# Patient Record
Sex: Female | Born: 1975 | Race: Black or African American | Hispanic: No | Marital: Married | Smoking: Never smoker
Health system: Southern US, Community
[De-identification: ages and names within clinical notes are randomized; demographics above are authoritative.]

## PROBLEM LIST (undated history)

## (undated) DIAGNOSIS — T7840XA Allergy, unspecified, initial encounter: Secondary | ICD-10-CM

## (undated) DIAGNOSIS — I10 Essential (primary) hypertension: Secondary | ICD-10-CM

## (undated) DIAGNOSIS — R7303 Prediabetes: Secondary | ICD-10-CM

## (undated) DIAGNOSIS — E559 Vitamin D deficiency, unspecified: Secondary | ICD-10-CM

## (undated) DIAGNOSIS — E785 Hyperlipidemia, unspecified: Secondary | ICD-10-CM

## (undated) DIAGNOSIS — E611 Iron deficiency: Secondary | ICD-10-CM

## (undated) HISTORY — DX: Iron deficiency: E61.1

## (undated) HISTORY — PX: CHOLECYSTECTOMY: SHX55

## (undated) HISTORY — DX: Essential (primary) hypertension: I10

## (undated) HISTORY — DX: Prediabetes: R73.03

## (undated) HISTORY — DX: Hyperlipidemia, unspecified: E78.5

## (undated) HISTORY — DX: Allergy, unspecified, initial encounter: T78.40XA

## (undated) HISTORY — DX: Vitamin D deficiency, unspecified: E55.9

---

## 2003-05-09 ENCOUNTER — Ambulatory Visit (HOSPITAL_COMMUNITY): Admission: RE | Admit: 2003-05-09 | Discharge: 2003-05-09 | Payer: Self-pay | Admitting: Obstetrics & Gynecology

## 2003-05-09 ENCOUNTER — Encounter: Payer: Self-pay | Admitting: Obstetrics & Gynecology

## 2003-07-04 ENCOUNTER — Ambulatory Visit (HOSPITAL_COMMUNITY): Admission: RE | Admit: 2003-07-04 | Discharge: 2003-07-04 | Payer: Self-pay | Admitting: Obstetrics & Gynecology

## 2003-07-04 ENCOUNTER — Encounter: Payer: Self-pay | Admitting: Obstetrics & Gynecology

## 2003-12-03 ENCOUNTER — Inpatient Hospital Stay (HOSPITAL_COMMUNITY): Admission: AD | Admit: 2003-12-03 | Discharge: 2003-12-05 | Payer: Self-pay | Admitting: Obstetrics & Gynecology

## 2004-08-20 ENCOUNTER — Emergency Department (HOSPITAL_COMMUNITY): Admission: EM | Admit: 2004-08-20 | Discharge: 2004-08-20 | Payer: Self-pay | Admitting: Emergency Medicine

## 2008-06-17 ENCOUNTER — Emergency Department (HOSPITAL_COMMUNITY): Admission: EM | Admit: 2008-06-17 | Discharge: 2008-06-17 | Payer: Self-pay | Admitting: Emergency Medicine

## 2015-12-30 MED FILL — NYSTATIN 100,000 UNIT/GM CR: 100000 | 20 days supply | Qty: 30 | Fill #0

## 2016-02-10 MED FILL — SPIRONOLACTONE 25 MG TABLET: 25 | 90 days supply | Qty: 90 | Fill #2

## 2016-02-10 MED FILL — FOLIC ACID 1 MG TABLET: 1 | 90 days supply | Qty: 90 | Fill #2

## 2016-02-12 MED FILL — METFORMIN HCL ER 750 MG TAB: 750 | 30 days supply | Qty: 60 | Fill #2

## 2016-04-26 MED FILL — METFORMIN HCL ER 750 MG TAB: 750 | 30 days supply | Qty: 60 | Fill #3

## 2016-07-26 DIAGNOSIS — D508 Other iron deficiency anemias: Secondary | ICD-10-CM | POA: Diagnosis not present

## 2016-07-26 DIAGNOSIS — R7303 Prediabetes: Secondary | ICD-10-CM | POA: Diagnosis not present

## 2016-07-26 DIAGNOSIS — Z1231 Encounter for screening mammogram for malignant neoplasm of breast: Secondary | ICD-10-CM | POA: Diagnosis not present

## 2016-07-26 DIAGNOSIS — E559 Vitamin D deficiency, unspecified: Secondary | ICD-10-CM | POA: Diagnosis not present

## 2016-07-26 DIAGNOSIS — R87610 Atypical squamous cells of undetermined significance on cytologic smear of cervix (ASC-US): Secondary | ICD-10-CM | POA: Diagnosis not present

## 2016-07-26 DIAGNOSIS — Z01419 Encounter for gynecological examination (general) (routine) without abnormal findings: Secondary | ICD-10-CM | POA: Diagnosis not present

## 2016-07-26 DIAGNOSIS — R87810 Cervical high risk human papillomavirus (HPV) DNA test positive: Secondary | ICD-10-CM | POA: Diagnosis not present

## 2016-07-26 DIAGNOSIS — Z1151 Encounter for screening for human papillomavirus (HPV): Secondary | ICD-10-CM | POA: Diagnosis not present

## 2016-07-28 MED FILL — VIT D2 1.25 MG (50,000 UNIT: 1.25 MG | 84 days supply | Qty: 12 | Fill #0

## 2016-07-28 MED FILL — HYDROCORT-PRAMOXINE 2.5-1%: 2.5-1 | 10 days supply | Qty: 30 | Fill #0

## 2016-07-28 MED FILL — SPIRONOLACTONE 50 MG TABLET: 50 | 90 days supply | Qty: 90 | Fill #0

## 2016-07-28 MED FILL — METFORMIN HCL ER 750 MG TAB: 750 | 30 days supply | Qty: 60 | Fill #0

## 2016-07-28 MED FILL — METOPROLOL SUCC ER 50 MG TA: 50 | 90 days supply | Qty: 90 | Fill #0

## 2016-07-28 MED FILL — FOLIC ACID 1 MG TABLET: 1 | 90 days supply | Qty: 90 | Fill #0

## 2016-07-29 MED FILL — FERREX 150 FORTE CAPSULE: 150-1-25 | 90 days supply | Qty: 90 | Fill #0

## 2016-09-10 DIAGNOSIS — R87610 Atypical squamous cells of undetermined significance on cytologic smear of cervix (ASC-US): Secondary | ICD-10-CM | POA: Diagnosis not present

## 2016-10-25 MED FILL — METFORMIN HCL ER 750 MG TAB: 750 | 30 days supply | Qty: 60 | Fill #1

## 2016-10-25 MED FILL — VIT D2 1.25 MG (50,000 UNIT: 1.25 MG | 84 days supply | Qty: 12 | Fill #1

## 2016-11-19 MED FILL — FOLIC ACID 1 MG TABLET: 1 | 90 days supply | Qty: 90 | Fill #1

## 2016-11-19 MED FILL — FERREX 150 FORTE CAPSULE: 150-1-25 | 90 days supply | Qty: 90 | Fill #1

## 2017-02-23 MED FILL — FERREX 150 FORTE CAPSULE: 150-1-25 | 90 days supply | Qty: 90 | Fill #2

## 2017-02-23 MED FILL — METFORMIN HCL ER 750 MG TAB: 750 | 30 days supply | Qty: 60 | Fill #2

## 2017-08-29 DIAGNOSIS — R87610 Atypical squamous cells of undetermined significance on cytologic smear of cervix (ASC-US): Secondary | ICD-10-CM | POA: Diagnosis not present

## 2017-08-29 DIAGNOSIS — Z13220 Encounter for screening for lipoid disorders: Secondary | ICD-10-CM | POA: Diagnosis not present

## 2017-08-29 DIAGNOSIS — I10 Essential (primary) hypertension: Secondary | ICD-10-CM | POA: Diagnosis not present

## 2017-08-29 DIAGNOSIS — R7303 Prediabetes: Secondary | ICD-10-CM | POA: Diagnosis not present

## 2017-08-29 DIAGNOSIS — D508 Other iron deficiency anemias: Secondary | ICD-10-CM | POA: Diagnosis not present

## 2017-08-29 DIAGNOSIS — Z6834 Body mass index (BMI) 34.0-34.9, adult: Secondary | ICD-10-CM | POA: Diagnosis not present

## 2017-08-29 DIAGNOSIS — Z1231 Encounter for screening mammogram for malignant neoplasm of breast: Secondary | ICD-10-CM | POA: Diagnosis not present

## 2017-08-29 DIAGNOSIS — Z1151 Encounter for screening for human papillomavirus (HPV): Secondary | ICD-10-CM | POA: Diagnosis not present

## 2017-08-29 DIAGNOSIS — Z01419 Encounter for gynecological examination (general) (routine) without abnormal findings: Secondary | ICD-10-CM | POA: Diagnosis not present

## 2017-08-29 MED FILL — AMLODIPINE BESYLATE 2.5 MG: 2.5 | 30 days supply | Qty: 30 | Fill #0

## 2017-08-29 MED FILL — HYDROCHLOROTHIAZIDE 12.5 MG: 12.5 | 30 days supply | Qty: 30 | Fill #0

## 2017-09-06 MED FILL — VIT D2 1.25 MG (50,000 UNIT: 1.25 MG | 84 days supply | Qty: 12 | Fill #0

## 2017-09-06 MED FILL — FERREX 150 CAPSULE: 150 | 90 days supply | Qty: 90 | Fill #0

## 2017-09-06 MED FILL — MAGNESIUM OXIDE 400 MG TAB: 400 (240 MG | 90 days supply | Qty: 90 | Fill #0

## 2017-10-04 DIAGNOSIS — I10 Essential (primary) hypertension: Secondary | ICD-10-CM | POA: Diagnosis not present

## 2017-10-04 MED FILL — AMLODIPINE BESYLATE 5 MG TA: 5 | 90 days supply | Qty: 90 | Fill #0

## 2017-10-04 MED FILL — HYDROCHLOROTHIAZIDE 12.5 MG: 12.5 | 90 days supply | Qty: 90 | Fill #0

## 2017-12-02 MED FILL — MAGNESIUM OXIDE 400 MG TAB: 400 (240 MG | 90 days supply | Qty: 90 | Fill #1

## 2017-12-02 MED FILL — FERREX 150 CAPSULE: 150 | 90 days supply | Qty: 90 | Fill #1

## 2017-12-02 MED FILL — VIT D2 1.25 MG (50,000 UNIT: 1.25 MG | 84 days supply | Qty: 12 | Fill #1

## 2018-01-08 MED FILL — HYDROCHLOROTHIAZIDE 12.5 MG: 12.5 | 90 days supply | Qty: 90 | Fill #1

## 2018-01-08 MED FILL — AMLODIPINE BESYLATE 5 MG TA: 5 | 90 days supply | Qty: 90 | Fill #1

## 2018-02-03 DIAGNOSIS — D508 Other iron deficiency anemias: Secondary | ICD-10-CM | POA: Diagnosis not present

## 2018-02-03 DIAGNOSIS — Z0000 Encounter for general adult medical examination without abnormal findings: Secondary | ICD-10-CM | POA: Diagnosis not present

## 2018-02-03 DIAGNOSIS — E559 Vitamin D deficiency, unspecified: Secondary | ICD-10-CM | POA: Diagnosis not present

## 2018-02-03 DIAGNOSIS — I10 Essential (primary) hypertension: Secondary | ICD-10-CM | POA: Diagnosis not present

## 2018-02-28 MED FILL — VIT D2 1.25 MG (50,000 UNIT: 1.25 MG | 84 days supply | Qty: 12 | Fill #0

## 2018-02-28 MED FILL — MAGNESIUM OXIDE 400 MG TAB: 400 (240 MG | 90 days supply | Qty: 90 | Fill #0

## 2018-02-28 MED FILL — FERREX 150 CAPSULE: 150 | 90 days supply | Qty: 90 | Fill #0

## 2018-04-10 MED FILL — AMLODIPINE BESYLATE 5 MG TA: 5 | 90 days supply | Qty: 90 | Fill #0

## 2018-04-10 MED FILL — HYDROCHLOROTHIAZIDE 12.5 MG: 12.5 | 90 days supply | Qty: 90 | Fill #0

## 2018-05-23 MED FILL — VIT D2 1.25 MG (50,000 UNIT: 1.25 MG | 84 days supply | Qty: 12 | Fill #1

## 2018-06-01 MED FILL — MAGNESIUM OXIDE 400 MG TAB: 400 (240 MG | 90 days supply | Qty: 90 | Fill #1

## 2018-06-01 MED FILL — FERREX 150 CAPSULE: 150 | 90 days supply | Qty: 90 | Fill #1

## 2018-07-12 MED FILL — AMLODIPINE BESYLATE 5 MG TA: 5 | 90 days supply | Qty: 90 | Fill #1

## 2018-07-12 MED FILL — HYDROCHLOROTHIAZIDE 12.5 MG: 12.5 | 90 days supply | Qty: 90 | Fill #1

## 2018-10-11 DIAGNOSIS — D508 Other iron deficiency anemias: Secondary | ICD-10-CM | POA: Diagnosis not present

## 2018-10-11 DIAGNOSIS — Z01419 Encounter for gynecological examination (general) (routine) without abnormal findings: Secondary | ICD-10-CM | POA: Diagnosis not present

## 2018-10-11 DIAGNOSIS — Z6834 Body mass index (BMI) 34.0-34.9, adult: Secondary | ICD-10-CM | POA: Diagnosis not present

## 2018-10-11 DIAGNOSIS — Z1231 Encounter for screening mammogram for malignant neoplasm of breast: Secondary | ICD-10-CM | POA: Diagnosis not present

## 2018-10-11 MED FILL — AMLODIPINE BESYLATE 5 MG TA: 5 | 90 days supply | Qty: 90 | Fill #0

## 2018-10-11 MED FILL — HYDROCHLOROTHIAZIDE 12.5 MG: 12.5 | 90 days supply | Qty: 90 | Fill #0

## 2018-10-11 MED FILL — FERREX 150 CAPSULE: 150 | 90 days supply | Qty: 90 | Fill #0

## 2018-10-11 MED FILL — MAGNESIUM OXIDE 400 MG TAB: 400 (240 MG | 90 days supply | Qty: 90 | Fill #0

## 2018-12-25 MED FILL — TERCONAZOLE 0.8% VAGINAL CR: 0.8 | 3 days supply | Qty: 20 | Fill #0

## 2019-01-11 MED FILL — FERREX 150 CAPSULE: 150 | 90 days supply | Qty: 90 | Fill #1

## 2019-01-11 MED FILL — HYDROCHLOROTHIAZIDE 12.5 MG: 12.5 | 90 days supply | Qty: 90 | Fill #1

## 2019-01-11 MED FILL — MAGNESIUM OXIDE 400 MG TAB: 400 (240 MG | 90 days supply | Qty: 90 | Fill #1

## 2019-02-15 MED FILL — AMLODIPINE BESYLATE 5 MG TA: 5 | 90 days supply | Qty: 90 | Fill #1

## 2019-04-12 MED FILL — MAGNESIUM OXIDE 400 MG TAB: 400 (240 MG | 90 days supply | Qty: 90 | Fill #2

## 2019-04-12 MED FILL — HYDROCHLOROTHIAZIDE 12.5 MG: 12.5 | 90 days supply | Qty: 90 | Fill #2

## 2019-04-12 MED FILL — FERREX 150 CAPSULE: 150 | 30 days supply | Qty: 30 | Fill #0

## 2019-04-20 MED FILL — FERREX 150 CAPSULE: 150 | 30 days supply | Qty: 30 | Fill #0

## 2019-05-08 DIAGNOSIS — I10 Essential (primary) hypertension: Secondary | ICD-10-CM | POA: Diagnosis not present

## 2019-05-08 DIAGNOSIS — D508 Other iron deficiency anemias: Secondary | ICD-10-CM | POA: Diagnosis not present

## 2019-05-16 MED FILL — FERREX 150 CAPSULE: 150 | 30 days supply | Qty: 30 | Fill #0

## 2019-05-18 MED FILL — AMLODIPINE BESYLATE 5 MG TA: 5 | 90 days supply | Qty: 90 | Fill #2

## 2019-07-06 MED FILL — HYDROCHLOROTHIAZIDE 12.5 MG: 12.5 | 90 days supply | Qty: 90 | Fill #3

## 2019-07-06 MED FILL — MAGNESIUM OXIDE 400 MG TAB: 400 (240 MG | 90 days supply | Qty: 90 | Fill #3

## 2019-10-10 MED FILL — MAGNESIUM OXIDE 400 MG TAB: 400 (240 MG | 90 days supply | Qty: 90 | Fill #4

## 2019-10-10 MED FILL — HYDROCHLOROTHIAZIDE 12.5 MG: 12.5 | 90 days supply | Qty: 90 | Fill #4

## 2019-10-19 DIAGNOSIS — D508 Other iron deficiency anemias: Secondary | ICD-10-CM | POA: Diagnosis not present

## 2019-10-19 DIAGNOSIS — Z01419 Encounter for gynecological examination (general) (routine) without abnormal findings: Secondary | ICD-10-CM | POA: Diagnosis not present

## 2019-10-19 DIAGNOSIS — Z6830 Body mass index (BMI) 30.0-30.9, adult: Secondary | ICD-10-CM | POA: Diagnosis not present

## 2019-10-19 DIAGNOSIS — I10 Essential (primary) hypertension: Secondary | ICD-10-CM | POA: Diagnosis not present

## 2019-10-19 DIAGNOSIS — Z0000 Encounter for general adult medical examination without abnormal findings: Secondary | ICD-10-CM | POA: Diagnosis not present

## 2019-10-19 DIAGNOSIS — Z1231 Encounter for screening mammogram for malignant neoplasm of breast: Secondary | ICD-10-CM | POA: Diagnosis not present

## 2019-10-19 DIAGNOSIS — Z131 Encounter for screening for diabetes mellitus: Secondary | ICD-10-CM | POA: Diagnosis not present

## 2019-10-22 MED FILL — AMLODIPINE BESYLATE 5 MG TA: 5 | 90 days supply | Qty: 90 | Fill #0

## 2019-10-22 MED FILL — MAGNESIUM OXIDE 400 MG TAB: 400 (240 MG | 90 days supply | Qty: 90 | Fill #0

## 2020-01-10 ENCOUNTER — Other Ambulatory Visit: Payer: Self-pay

## 2020-01-10 ENCOUNTER — Ambulatory Visit (INDEPENDENT_AMBULATORY_CARE_PROVIDER_SITE_OTHER): Payer: No Typology Code available for payment source

## 2020-01-10 ENCOUNTER — Encounter: Payer: Self-pay | Admitting: Family Medicine

## 2020-01-10 ENCOUNTER — Ambulatory Visit: Payer: No Typology Code available for payment source | Admitting: Family Medicine

## 2020-01-10 VITALS — BP 110/82 | HR 84 | Ht 66.5 in | Wt 198.8 lb

## 2020-01-10 DIAGNOSIS — M5412 Radiculopathy, cervical region: Secondary | ICD-10-CM

## 2020-01-10 DIAGNOSIS — M25511 Pain in right shoulder: Secondary | ICD-10-CM

## 2020-01-10 MED ORDER — GABAPENTIN 300 MG PO CAPS
ORAL_CAPSULE | ORAL | 3 refills | Status: DC
Start: 2020-01-10 — End: 2021-01-30

## 2020-01-10 MED ORDER — HYDROCODONE-ACETAMINOPHEN 5-325 MG PO TABS: 1.0000 | ORAL_TABLET | Freq: Four times a day (QID) | ORAL | 0 refills | Status: DC | PRN

## 2020-01-10 NOTE — Patient Instructions (Addendum)
Thank you for coming in today. Get xray now.  Schedule MRI soon.  Take gabapentin and use hydrocodone sparingly.   I will order PT once MRI is done If MRI is taking a while let me know and I can get PT going sooner.   Come back or go to the emergency room if you notice new weakness new numbness problems walking or bowel or bladder problems.  Cervical Radiculopathy  Cervical radiculopathy means that a nerve in the neck (a cervical nerve) is pinched or bruised. This can happen because of an injury to the cervical spine (vertebrae) in the neck, or as a normal part of getting older. This can cause pain or loss of feeling (numbness) that runs from your neck all the way down to your arm and fingers. Often, this condition gets better with rest. Treatment may be needed if the condition does not get better. What are the causes?  A neck injury.  A bulging disk in your spine.  Muscle movements that you cannot control (muscle spasms).  Tight muscles in your neck due to overuse.  Arthritis.  Breakdown in the bones and joints of the spine (spondylosis) due to getting older.  Bone spurs that form near the nerves in the neck. What are the signs or symptoms?  Pain. The pain may: ? Run from the neck to the arm and hand. ? Be very bad or irritating. ? Be worse when you move your neck.  Loss of feeling or tingling in your arm or hand.  Weakness in your arm or hand, in very bad cases. How is this treated? In many cases, treatment is not needed for this condition. With rest, the condition often gets better over time. If treatment is needed, options may include:  Wearing a soft neck collar (cervical collar) for short periods of time, as told by your doctor.  Doing exercises (physical therapy) to strengthen your neck muscles.  Taking medicines.  Having shots (injections) in your spine, in very bad cases.  Having surgery. This may be needed if other treatments do not help. The type of surgery  that is used depends on the cause of your condition. Follow these instructions at home: If you have a soft neck collar:  Wear it as told by your doctor. Remove it only as told by your doctor.  Ask your doctor if you can remove the collar for cleaning and bathing. If you are allowed to remove the collar for cleaning or bathing: ? Follow instructions from your doctor about how to remove the collar safely. ? Clean the collar by wiping it with mild soap and water and drying it completely. ? Take out any removable pads in the collar every 1-2 days. Wash them by hand with soap and water. Let them air-dry completely before you put them back in the collar. ? Check your skin under the collar for redness or sores. If you see any, tell your doctor. Managing pain      Take over-the-counter and prescription medicines only as told by your doctor.  If told, put ice on the painful area. ? If you have a soft neck collar, remove it as told by your doctor. ? Put ice in a plastic bag. ? Place a towel between your skin and the bag. ? Leave the ice on for 20 minutes, 2-3 times a day.  If using ice does not help, you can try using heat. Use the heat source that your doctor recommends, such as a moist  heat pack or a heating pad. ? Place a towel between your skin and the heat source. ? Leave the heat on for 20-30 minutes. ? Remove the heat if your skin turns bright red. This is very important if you are unable to feel pain, heat, or cold. You may have a greater risk of getting burned.  You may try a gentle neck and shoulder rub (massage). Activity  Rest as needed.  Return to your normal activities as told by your doctor. Ask your doctor what activities are safe for you.  Do exercises as told by your doctor or physical therapist.  Do not lift anything that is heavier than 10 lb (4.5 kg) until your doctor tells you that it is safe. General instructions  Use a flat pillow when you sleep.  Do not drive  while wearing a soft neck collar. If you do not have a soft neck collar, ask your doctor if it is safe to drive while your neck heals.  Ask your doctor if the medicine prescribed to you requires you to avoid driving or using heavy machinery.  Do not use any products that contain nicotine or tobacco, such as cigarettes, e-cigarettes, and chewing tobacco. These can delay healing. If you need help quitting, ask your doctor.  Keep all follow-up visits as told by your doctor. This is important. Contact a doctor if:  Your condition does not get better with treatment. Get help right away if:  Your pain gets worse and is not helped with medicine.  You lose feeling or feel weak in your hand, arm, face, or leg.  You have a high fever.  You have a stiff neck.  You cannot control when you poop or pee (have incontinence).  You have trouble with walking, balance, or talking. Summary  Cervical radiculopathy means that a nerve in the neck is pinched or bruised.  A nerve can get pinched from a bulging disk, arthritis, an injury to the neck, or other causes.  Symptoms include pain, tingling, or loss of feeling that goes from the neck into the arm or hand.  Weakness in your arm or hand can happen in very bad cases.  Treatment may include resting, wearing a soft neck collar, and doing exercises. You might need to take medicines for pain. In very bad cases, shots or surgery may be needed. This information is not intended to replace advice given to you by your health care provider. Make sure you discuss any questions you have with your health care provider. Document Revised: 10/20/2018 Document Reviewed: 10/20/2018 Elsevier Patient Education  2020 [employer].

## 2020-01-10 NOTE — Progress Notes (Signed)
Subjective:    CC: R shoulder and arm pain  I, Karla Krause, LAT, ATC, am serving as scribe for Dr. Lynne Leader.  HPI: Pt is a 44 y/o LHD female presenting w/ c/o R arm and shoulder pain x 2 weeks w/ no known MOI.  Pt rates her pain at a 9/10 and describes her pain as aching and throbbing.  Pain radiates to the right forearm but not to the hand.  No numbness or tingling into the hand.  She states she went to the Urgent Care last Saturday and had an IM injection and was prescribed a steroid dose pack that she will finish tomorrow.  Pt reports no change in her symptoms w/ those treatments.  Pain seems to be worse with neck motion.  Pain is a bit improved by putting her hand over her head.  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  She has not noticed any weakness.  Radiating pain: into R upper arm Neck pain: yes Numbness/tingling: Yes into R fingers Mechanical R shoulder symptoms: No Aggravating factors: R shoulder and arm AROM and any type of pressing or propping-type activity Treatments tried: steroid IM injection and oral steroid dose pack; Biofreeze; IBU  Pertinent review of Systems: No fevers or chills  Relevant historical information: No history of shoulder injury or cervical radiculopathy in the past.   Objective:    Vitals:   01/10/20 1504  BP: 110/82  Pulse: 84  SpO2: 97%   General: Well Developed, well nourished, and in no acute distress.   MSK:  C-spine: Normal-appearing nontender to midline.  Mildly tender palpation right trapezius. Normal cervical motion. Significantly positive Spurling's test with left lateral neck flexion worsening right arm symptoms. Upper extremity strength slightly diminished 4/5 deltoid/arm abduction.  Otherwise right upper extremity strength is intact. Reflexes equal normal bilateral upper extremities. Sensation is intact throughout.  Right shoulder: Normal-appearing normal motion. Abduction slightly  diminished as above 4/5.  External rotation internal rotation strength normal. Negative Hawkins and Neer's test. Negative Yergason's and speeds test.  Contralateral left shoulder normal-appearing normal motion nontender normal strength negative impingement testing.  Lab and Radiology Results X-ray images C-spine obtained today personally and independently reviewed Loss of cervical lordosis.  DDD at C6-7.  No acute fractures. Await formal radiology review   Impression and Recommendations:    Assessment and Plan: 44 y.o. female with right shoulder and arm pain.  Based on physical exam and symptoms her pain is due to cervical radiculopathy.  Most likely C5 or T1 nerve root based on pain radiating to the forearm but not to the hand.  Although patchy distribution of C6, C7 or C8 is a possibility as well. Patient is already had trial of steroid Dosepak with little benefit.  We will add gabapentin hydrocodone for pain control and proceed with MRI.  MRI needed earlier than 6 weeks in this case because she is experiencing weakness in her right arm especially with shoulder abduction/deltoid.  Worsening/progressive symptoms.  PDMP reviewed during this encounter. Orders Placed This Encounter  Procedures  . DG Cervical Spine Complete    Standing Status:   Future    Number of Occurrences:   1    Standing Expiration Date:   03/09/2021    Order Specific Question:   Reason for Exam (SYMPTOM  OR DIAGNOSIS REQUIRED)    Answer:   eval right arm pain suspect C5 or T1 radic    Order  Specific Question:   Is patient pregnant?    Answer:   No    Order Specific Question:   Preferred imaging location?    Answer:   [facility]    Order Specific Question:   Radiology Contrast Protocol - do NOT remove file path    Answer:   \\[email or web address]  . MR CERVICAL SPINE WO CONTRAST    Standing Status:   Future    Standing Expiration Date:   03/09/2021    Order Specific  Question:   What is the patient's sedation requirement?    Answer:   No Sedation    Order Specific Question:   Does the patient have a pacemaker or implanted devices?    Answer:   No    Order Specific Question:   Preferred imaging location?    Answer:   [facility] (table limit-350lbs)    Order Specific Question:   Radiology Contrast Protocol - do NOT remove file path    Answer:   \\[email or web address]   Meds ordered this encounter  Medications  . gabapentin (NEURONTIN) 300 MG capsule    Sig: One tab PO qHS for a week, then BID for a week, then TID. May double weekly to a max of 3,600mg /day    Dispense:  180 capsule    Refill:  3  . HYDROcodone-acetaminophen (NORCO/VICODIN) 5-325 MG tablet    Sig: Take 1 tablet by mouth every 6 (six) hours as needed.    Dispense:  15 tablet    Refill:  0    Discussed warning signs or symptoms. Please see discharge instructions. Patient expresses understanding.   The above documentation has been reviewed and is accurate and complete [name]

## 2020-01-11 NOTE — Progress Notes (Signed)
Arthritis and narrowing of the spine seen at C5 and C6 and C6 and C7.  A pinched nerve in this area could cause pain down your arm like you have.  MRI will be helpful here.  You should hear about MRI scheduling soon.

## 2020-01-21 ENCOUNTER — Ambulatory Visit (INDEPENDENT_AMBULATORY_CARE_PROVIDER_SITE_OTHER): Payer: No Typology Code available for payment source

## 2020-01-21 ENCOUNTER — Other Ambulatory Visit: Payer: Self-pay

## 2020-01-21 DIAGNOSIS — M5412 Radiculopathy, cervical region: Secondary | ICD-10-CM

## 2020-01-22 ENCOUNTER — Encounter: Payer: Self-pay | Admitting: Family Medicine

## 2020-01-22 ENCOUNTER — Ambulatory Visit: Payer: No Typology Code available for payment source | Admitting: Family Medicine

## 2020-01-22 ENCOUNTER — Telehealth: Payer: Self-pay | Admitting: Family Medicine

## 2020-01-22 VITALS — BP 120/76 | HR 94 | Ht 66.5 in | Wt 201.8 lb

## 2020-01-22 DIAGNOSIS — M5412 Radiculopathy, cervical region: Secondary | ICD-10-CM | POA: Diagnosis not present

## 2020-01-22 NOTE — Telephone Encounter (Signed)
Routed/faxed epidural order and info to [facility].

## 2020-01-22 NOTE — Telephone Encounter (Signed)
Epidural steroid injection ordered.  Please notify [facility] imaging.

## 2020-01-22 NOTE — Progress Notes (Signed)
MRI was very helpful. MRI shows a bulging disc at C5-C6 pressing on the right C6 nerve root causing your right arm pain.  We will try to treat this with an injection into your neck that should help.  Recommend return to clinic to discuss the MRI in full detail and to discuss the neck injection.  However I will go ahead and order the neck injection now so we can get the process started to get you feeling better.

## 2020-01-22 NOTE — Patient Instructions (Addendum)
Thank you for coming in today. Please call [facility] to schedule the injection.  [phone] Keep me updated after the injection.  We may need to do more than 1.    Epidural Steroid Injection Patient Information  Description: The epidural space surrounds the nerves as they exit the spinal cord.  In some patients, the nerves can be compressed and inflamed by a bulging disc or a tight spinal canal (spinal stenosis).  By injecting steroids into the epidural space, we can bring irritated nerves into direct contact with a potentially helpful medication.  These steroids act directly on the irritated nerves and can reduce swelling and inflammation which often leads to decreased pain.  Epidural steroids may be injected anywhere along the spine and from the neck to the low back depending upon the location of your pain.   After numbing the skin with local anesthetic (like Novocaine), a small needle is passed into the epidural space slowly.  You may experience a sensation of pressure while this is being done.  The entire block usually last less than 10 minutes.  Conditions which may be treated by epidural steroids:   Low back and leg pain  Neck and arm pain  Spinal stenosis  Post-laminectomy syndrome  Herpes zoster (shingles) pain  Pain from compression fractures  Preparation for the injection:  1. Do not eat any solid food or dairy products within 8 hours of your appointment.  2. You may drink clear liquids up to 3 hours before appointment.  Clear liquids include water, black coffee, juice or soda.  No milk or cream please. 3. You may take your regular medication, including pain medications, with a sip of water before your appointment  Diabetics should hold regular insulin (if taken separately) and take 1/2 normal NPH dos the morning of the procedure.  Carry some sugar containing items with you to your appointment. 4. A driver must accompany you and be prepared to drive you home  after your procedure.  5. Bring all your current medications with your. 6. An IV may be inserted and sedation may be given at the discretion of the physician.   7. A blood pressure cuff, EKG and other monitors will often be applied during the procedure.  Some patients may need to have extra oxygen administered for a short period. 8. You will be asked to provide medical information, including your allergies, prior to the procedure.  We must know immediately if you are taking blood thinners (like Coumadin/Warfarin)  Or if you are allergic to IV iodine contrast (dye). We must know if you could possible be pregnant.  Possible side-effects:  Bleeding from needle site  Infection (rare, may require surgery)  Nerve injury (rare)  Numbness & tingling (temporary)  Difficulty urinating (rare, temporary)  Spinal headache ( a headache worse with upright posture)  Light -headedness (temporary)  Pain at injection site (several days)  Decreased blood pressure (temporary)  Weakness in arm/leg (temporary)  Pressure sensation in back/neck (temporary)  Call if you experience:  Fever/chills associated with headache or increased back/neck pain.  Headache worsened by an upright position.  New onset weakness or numbness of an extremity below the injection site  Hives or difficulty breathing (go to the emergency room)  Inflammation or drainage at the infection site  Severe back/neck pain  Any new symptoms which are concerning to you  Please note:  Although the local anesthetic injected can often make your back or neck feel good for several hours after  the injection, the pain will likely return.  It takes 3-7 days for steroids to work in the epidural space.  You may not notice any pain relief for at least that one week.  If effective, we will often do a series of three injections spaced 3-6 weeks apart to maximally decrease your pain.  After the initial series, we generally will wait several  months before considering a repeat injection of the same type.  c

## 2020-01-22 NOTE — Progress Notes (Signed)
I, Christoper Fabian, LAT, ATC, am serving as scribe for Dr. [name].  Karla Krause is a 44 y.o. female who presents to [facility] at [facility] today for f/u of neck and R arm pain and c-spine MRI review from 01/21/20.  Pt was last seen by Dr. Denyse Amass on 01/10/20 w/ c/o radiating R UE pain from her shoulder to her R upper arm and numbness/tingling into her R hand/fingers.  She had a c-spine XR on 01/10/20 and an order was placed for a c-spine MRI.  She was also prescribed gabapentin and hydrocodone.  Since her last visit, pt reports no change in her symptoms.  She has been taking the gabapentin and hydrocodone as prescribed and notes slight improvement in her pain but not much.  She notes now she is experiencing a bit of numbness or tingling into her right thumb.  She denies any new or worsening weakness or contralateral left-sided symptoms.   Pertinent review of systems: No fevers or chills  Relevant historical information: No significant medical problems in history.   Exam:  BP 120/76 (BP Location: Left Arm, Patient Position: Sitting, Cuff Size: Large)   Pulse 94   Ht 5' 6.5" (1.689 m)   Wt 201 lb 12.8 oz (91.5 kg)   LMP 01/06/2020   SpO2 97%   BMI 32.08 kg/m  General: Well Developed, well nourished, and in no acute distress.   MSK: C-spine: Normal-appearing normal motion. Upper extremity strength is intact.    Lab and Radiology Results No results found for this or any previous visit (from the past 72 hour(s)). MR CERVICAL SPINE WO CONTRAST  Result Date: 01/21/2020 CLINICAL DATA:  Cervical radiculitis. Cervical radiculopathy, no red flags. Additional history provided by technologist: Cervical radiculitis for 3 weeks, right arm pain, numbness in right fingertips. EXAM: MRI CERVICAL SPINE WITHOUT CONTRAST TECHNIQUE: Multiplanar, multisequence MR imaging of the cervical spine was performed. No intravenous contrast was administered. COMPARISON:  Radiographs of the  cervical spine 01/10/2020 FINDINGS: Alignment: Reversal of the expected cervical lordosis centered at C5-C6. No significant spondylolisthesis. Vertebrae: Vertebral body height is maintained. No marrow edema or suspicious osseous lesion. Cord: No spinal cord signal abnormality. Posterior Fossa, vertebral arteries, paraspinal tissues: No abnormality identified within included portions of the posterior fossa. Flow voids preserved within the cervical vertebral arteries. Paraspinal soft tissues within normal limits. Disc levels: Moderate C5-C6 and C6-C7 disc degeneration. Mild disc degeneration at the remaining levels. C2-C3: No disc herniation. No significant canal or foraminal stenosis. C3-C4: No disc herniation. No significant canal or foraminal stenosis. C4-C5: Shallow disc bulge with superimposed small central disc protrusion. No significant spinal canal or neural foraminal narrowing. C5-C6: Disc bulge. Superimposed right center/foraminal disc protrusion (series 9, image 15) (series 8, images 5 and 6). Right greater than left disc osteophyte ridge/uncinate hypertrophy. The posterior disc osteophyte complex partially effaces the ventral thecal sac and may contact the ventral spinal cord, contributing to overall mild spinal canal stenosis. The right center/foraminal disc protrusion contributes to severe narrowing of the right foraminal entry zone with likely impingement of the exiting right C6 nerve root. No significant left neural foraminal narrowing. C6-C7: Shallow posterior disc osteophyte complex with mild endplate spurring and uncinate hypertrophy. No significant spinal canal stenosis or neural foraminal narrowing. C7-T1: No disc herniation. No significant canal or foraminal stenosis. IMPRESSION: Cervical spondylosis as described and most notably as follows. At C5-C6, disc bulge with superimposed right center/foraminal disc protrusion. Right greater than left disc osteophyte  ridge/uncinate hypertrophy. Mild  spinal canal stenosis with possible contact upon the ventral spinal cord. The disc protrusion contributes to severe narrowing of the right foraminal entry zone, likely impinging the exiting right C6 nerve root. Correlate for right C6 radiculopathy. No more than mild spinal canal stenosis at the remaining levels. No other sites of significant neural foraminal narrowing. Electronically Signed   By: Kellie Simmering DO   On: 01/21/2020 17:38    I, Lynne Leader, personally (independently) visualized and performed the interpretation of the images attached in this note.    Assessment and Plan: 44 y.o. female with right cervical radiculopathy at C6.  Plan for epidural steroid injection as typical conservative management is not helpful.  Recheck as needed.  Lengthy discussion regarding MRI findings and epidural steroid injection plan.  Patient will keep me updated with how she is feeling.      Discussed warning signs or symptoms. Please see discharge instructions. Patient expresses understanding.   The above documentation has been reviewed and is accurate and complete Lynne Leader

## 2020-02-01 ENCOUNTER — Inpatient Hospital Stay: Admission: RE | Admit: 2020-02-01 | Payer: No Typology Code available for payment source | Source: Ambulatory Visit

## 2020-02-04 ENCOUNTER — Other Ambulatory Visit: Payer: Self-pay

## 2020-02-04 ENCOUNTER — Ambulatory Visit
Admission: RE | Admit: 2020-02-04 | Discharge: 2020-02-04 | Disposition: A | Discharge status: Home / Self Care | Payer: No Typology Code available for payment source | Source: Ambulatory Visit | Attending: Family Medicine | Admitting: Family Medicine

## 2020-02-04 DIAGNOSIS — M5412 Radiculopathy, cervical region: Secondary | ICD-10-CM

## 2020-02-04 MED ORDER — TRIAMCINOLONE ACETONIDE 40 MG/ML IJ SUSP (RADIOLOGY): 60.0000 mg | Freq: Once | INTRAMUSCULAR | Status: DC

## 2020-02-04 MED ORDER — IOPAMIDOL (ISOVUE-M 300) INJECTION 61%
1.0000 mL | Freq: Once | INTRAMUSCULAR | Status: AC | PRN
  Administered 2020-02-04: 14:00:00 1 mL via EPIDURAL

## 2020-02-04 NOTE — Discharge Instructions (Signed)

## 2020-08-21 IMAGING — DX DG CERVICAL SPINE COMPLETE 4+V
6 series · 6 of 6 positions shown · non-contrast
Comparison: None.

CLINICAL DATA: Right-sided neck pain.

EXAM:
CERVICAL SPINE - COMPLETE 4+ VIEW

[cervical spine lat]
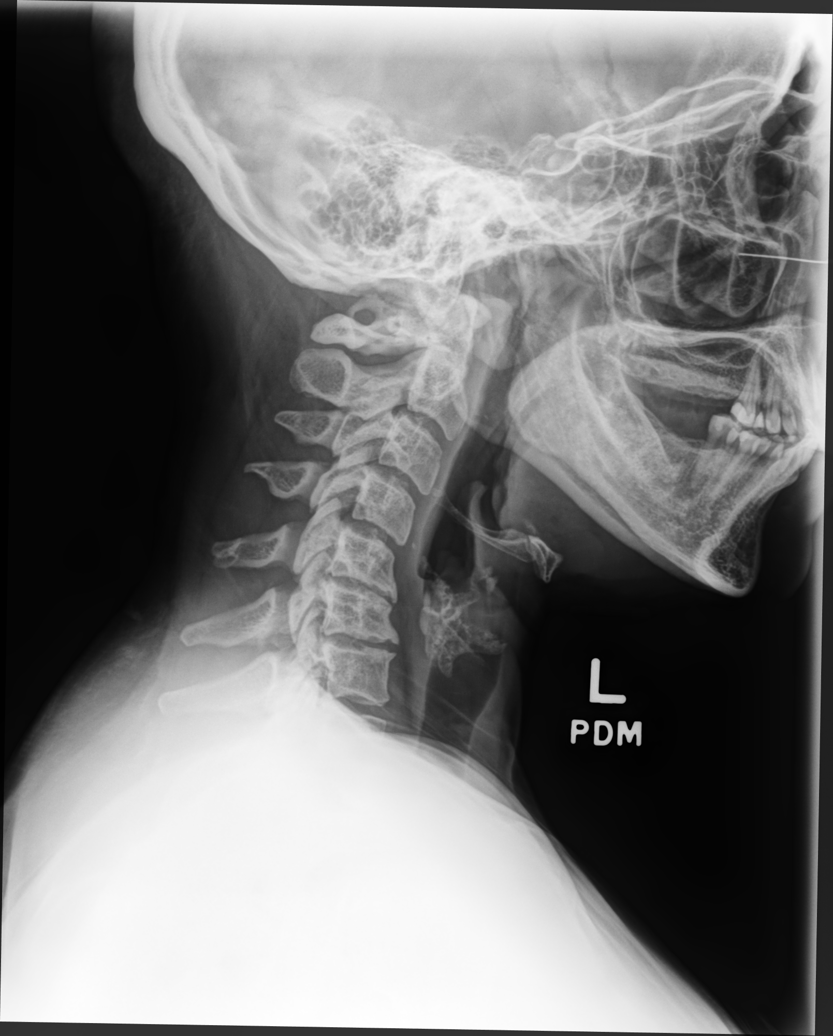

[cervical spine mlo (1 of 2)]
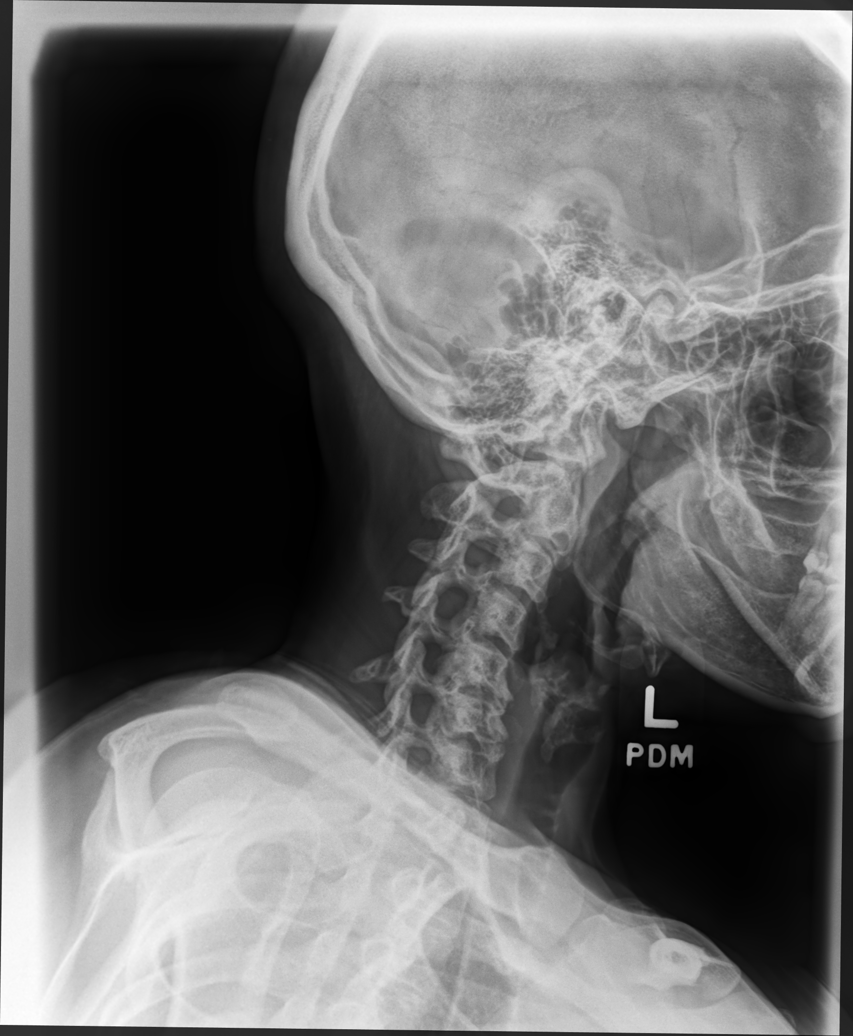

[cervical spine mlo (2 of 2)]
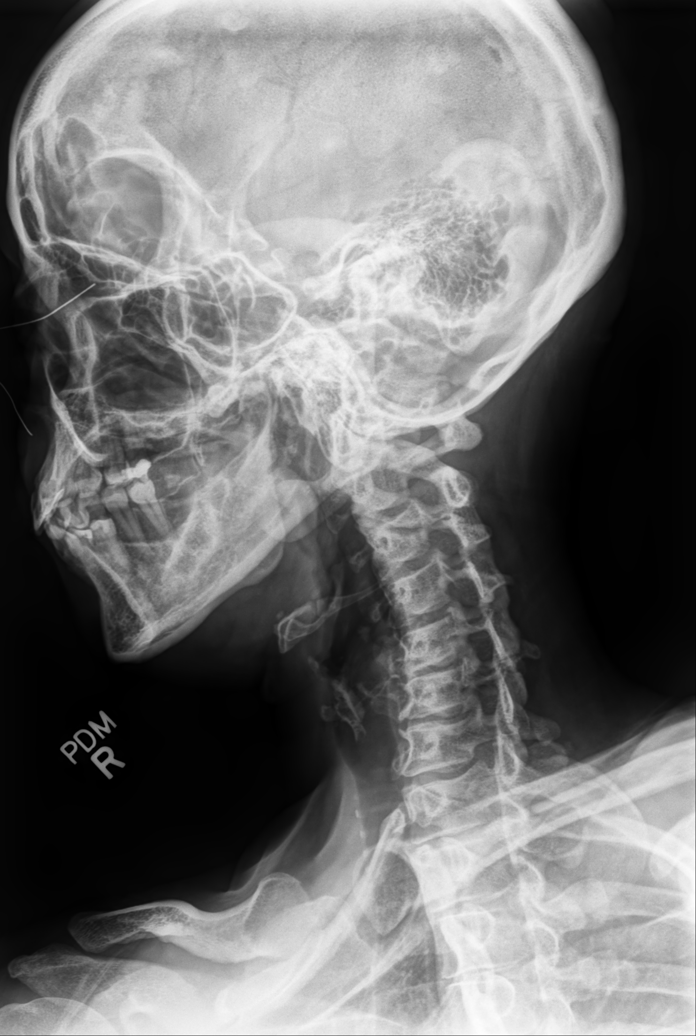

[cervical spine ap]
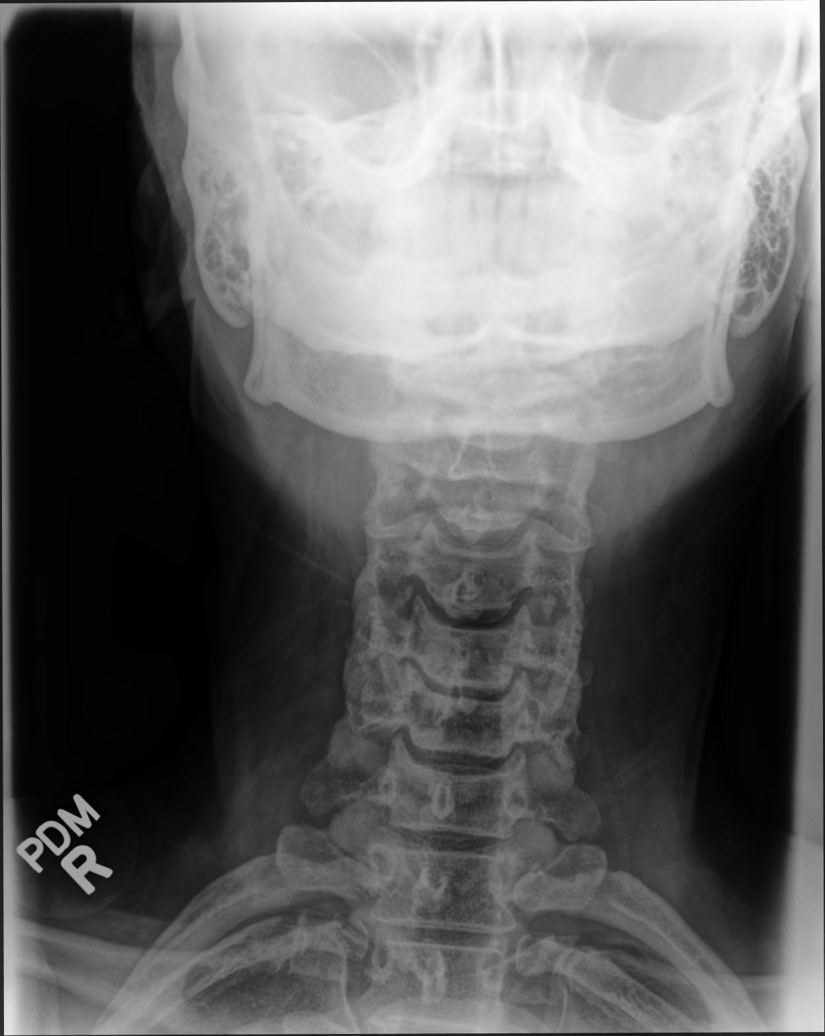

[cervical spine open mouth ap]
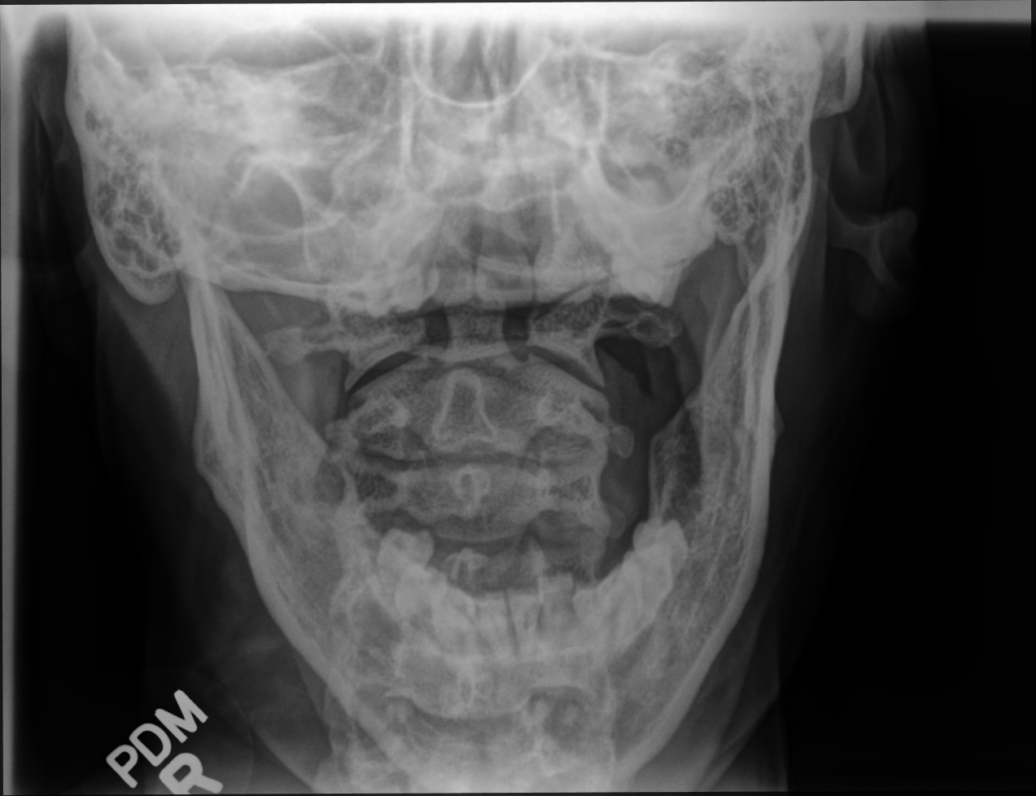

[cervical spine (swimmers) lat]
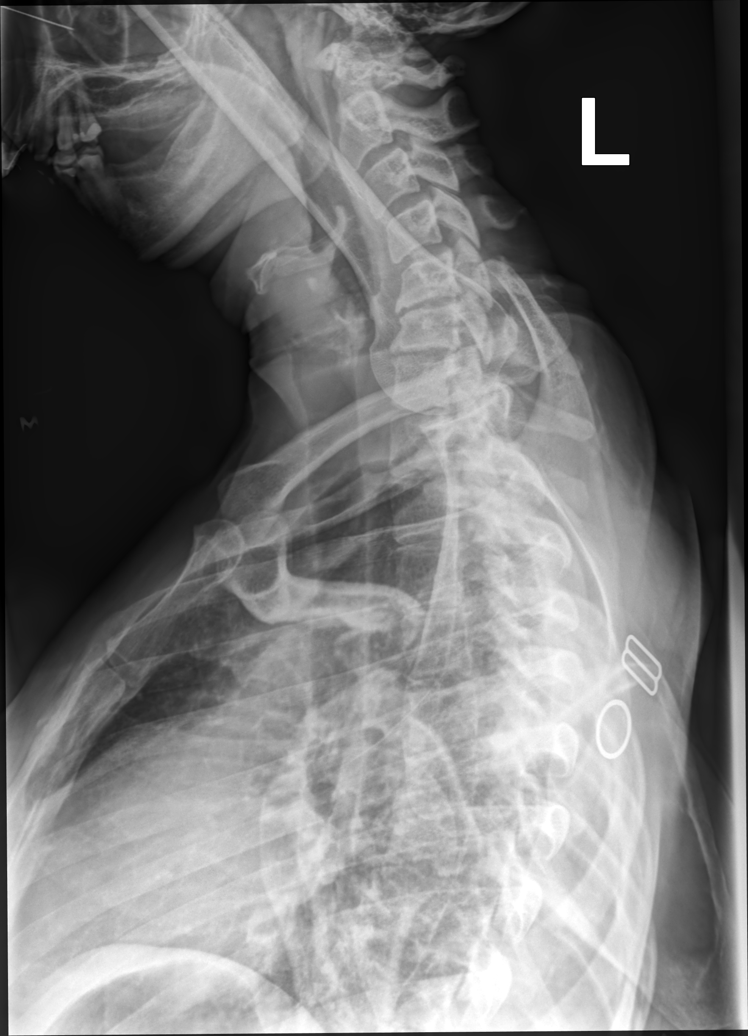

[6 of 6 positions shown; findings below may reference images not displayed]

FINDINGS: There is no evidence of cervical spine fracture or prevertebral soft
tissue swelling. There is exaggeration of the normal cervical
lordosis. Moderate severity endplate sclerosis is seen at the levels
of C5-C6 and C6-C7. Moderate severity intervertebral disc space
narrowing is also seen at these levels.
IMPRESSION: Moderate severity degenerative disc disease at C5-C6 and C6-C7.

## 2021-01-30 ENCOUNTER — Other Ambulatory Visit: Payer: Self-pay

## 2021-01-30 ENCOUNTER — Encounter: Payer: Self-pay | Admitting: Internal Medicine

## 2021-01-30 ENCOUNTER — Telehealth (INDEPENDENT_AMBULATORY_CARE_PROVIDER_SITE_OTHER): Payer: No Typology Code available for payment source | Admitting: Internal Medicine

## 2021-01-30 VITALS — Ht 62.0 in | Wt 198.0 lb

## 2021-01-30 DIAGNOSIS — Z7689 Persons encountering health services in other specified circumstances: Secondary | ICD-10-CM | POA: Diagnosis not present

## 2021-01-30 NOTE — Progress Notes (Signed)
Virtual Visit via Telephone Note  I connected with Karla Krause, on 01/30/2021 at 8:48 AM by telephone due to the COVID-19 pandemic and verified that I am speaking with the correct person using two identifiers.   Consent: I discussed the limitations, risks, security and privacy concerns of performing an evaluation and management service by telephone and the availability of in person appointments. I also discussed with the patient that there may be a patient responsible charge related to this service. The patient expressed understanding and agreed to proceed.   Location of Patient: Home   Location of Provider: Clinic    Persons participating in Telemedicine visit: Brandelyn Henne Dr. Earlene Plater      History of Present Illness: Patient has a visit to establish care. Has not been seen by PCP for many years. Has been seeing her OBGYN. No PMH. Surgical history of cholecystectomy. Not currently taking any medications Rx. Takes MVI. She works as Lawyer for [employer]. She is UTD on PAP. She receives mammograms yearly. Due for COVID booster in the upcoming days. No concerns she would like to discuss over the phone.    No past medical history on file. No Known Allergies  Current Outpatient Medications on File Prior to Visit  Medication Sig Dispense Refill  . Multiple Vitamin (MULTIVITAMIN) tablet Take 1 tablet by mouth daily.    Marland Kitchen gabapentin (NEURONTIN) 300 MG capsule One tab PO qHS for a week, then BID for a week, then TID. May double weekly to a max of 3,600mg /day (Patient not taking: Reported on 01/30/2021) 180 capsule 3  . HYDROcodone-acetaminophen (NORCO/VICODIN) 5-325 MG tablet Take 1 tablet by mouth every 6 (six) hours as needed. (Patient not taking: Reported on 01/30/2021) 15 tablet 0   No current facility-administered medications on file prior to visit.    Observations/Objective: NAD. Speaking clearly.  Work of breathing normal.  Alert and oriented. Mood  appropriate.   Assessment and Plan: 1. Encounter to establish care Reviewed patient's PMH, social history, surgical history, and medications.  Plans to have preventative visit with OBGYN in March.   Follow Up Instructions: PRN and for yearly visits    I discussed the assessment and treatment plan with the patient. The patient was provided an opportunity to ask questions and all were answered. The patient agreed with the plan and demonstrated an understanding of the instructions.   The patient was advised to call back or seek an in-person evaluation if the symptoms worsen or if the condition fails to improve as anticipated.     I provided 7 minutes total of non-face-to-face time during this encounter including median intraservice time, reviewing previous notes, investigations, ordering medications, medical decision making, coordinating care and patient verbalized understanding at the end of the visit.    Karla Krause, D.O. Primary Care at [facility]  01/30/2021, 8:48 AM

## 2021-03-15 ENCOUNTER — Other Ambulatory Visit (HOSPITAL_COMMUNITY): Payer: Self-pay

## 2021-03-15 MED ORDER — METRONIDAZOLE 500 MG PO TABS
500.0000 mg | ORAL_TABLET | Freq: Two times a day (BID) | ORAL | 1 refills | Status: DC
  Filled 2021-03-15: qty 14, 7d supply, fill #0
  Filled 2021-03-24: qty 14, 7d supply, fill #1

## 2021-03-16 ENCOUNTER — Other Ambulatory Visit (HOSPITAL_COMMUNITY): Payer: Self-pay

## 2021-03-24 ENCOUNTER — Other Ambulatory Visit (HOSPITAL_COMMUNITY): Payer: Self-pay

## 2021-03-31 NOTE — Progress Notes (Signed)
Patient ID: Karla Krause, female    DOB: 1976-04-17  MRN: [id number]  CC: Annual Physical Exam  Subjective: Karla Krause is a 45 y.o. female who presents for annual physical exam. Her concerns today include: none. Gynecology appointment completed 02/10/2021. Mammogram scheduled 04/14/2021.  Patient Active Problem List   Diagnosis Date Noted  . Cervical radiculitis 01/22/2020     Current Outpatient Medications on File Prior to Visit  Medication Sig Dispense Refill  . Multiple Vitamin (MULTIVITAMIN) tablet Take 1 tablet by mouth daily.     No current facility-administered medications on file prior to visit.    No Known Allergies  Social History   Socioeconomic History  . Marital status: Married    Spouse name: Not on file  . Number of children: Not on file  . Years of education: Not on file  . Highest education level: Not on file  Occupational History  . Not on file  Tobacco Use  . Smoking status: Never Smoker  . Smokeless tobacco: Never Used  Vaping Use  . Vaping Use: Never used  Substance and Sexual Activity  . Alcohol use: Not Currently  . Drug use: Never  . Sexual activity: Not on file  Other Topics Concern  . Not on file  Social History Narrative  . Not on file   Social Determinants of Health   Financial Resource Strain: Not on file  Food Insecurity: Not on file  Transportation Needs: Not on file  Physical Activity: Not on file  Stress: Not on file  Social Connections: Not on file  Intimate Partner Violence: Not on file    History reviewed. No pertinent family history.  History reviewed. No pertinent surgical history.  ROS: Review of Systems Negative except as stated above  PHYSICAL EXAM: BP 135/87 (BP Location: Left Arm, Patient Position: Sitting)   Pulse 85   Ht 5' 6.69" (1.694 m)   Wt 195 lb (88.5 kg)   SpO2 98%   BMI 30.82 kg/m    Wt Readings from Last 3 Encounters:  04/01/21 195 lb (88.5 kg)  01/30/21 198 lb (89.8 kg)   01/22/20 201 lb 12.8 oz (91.5 kg)    Physical Exam HENT:     Head: Normocephalic and atraumatic.     Right Ear: Tympanic membrane, ear canal and external ear normal.     Left Ear: Tympanic membrane, ear canal and external ear normal.     Nose: Nose normal.     Mouth/Throat:     Mouth: Mucous membranes are moist.     Pharynx: Oropharynx is clear.  Eyes:     Extraocular Movements: Extraocular movements intact.     Conjunctiva/sclera: Conjunctivae normal.     Pupils: Pupils are equal, round, and reactive to light.  Cardiovascular:     Rate and Rhythm: Normal rate and regular rhythm.     Pulses: Normal pulses.     Heart sounds: Normal heart sounds.  Pulmonary:     Effort: Pulmonary effort is normal.     Breath sounds: Normal breath sounds.  Chest:     Comments: Patient declined examination. Abdominal:     General: Bowel sounds are normal.     Palpations: Abdomen is soft.  Genitourinary:    Comments: Patient declined examination.  Musculoskeletal:        General: Normal range of motion.     Cervical back: Normal range of motion and neck supple.  Skin:    General: Skin is warm and dry.  Capillary Refill: Capillary refill takes less than 2 seconds.  Neurological:     General: No focal deficit present.     Mental Status: She is alert and oriented to person, place, and time.  Psychiatric:        Mood and Affect: Mood normal.        Behavior: Behavior normal.     ASSESSMENT AND PLAN: 1. Annual physical exam: - Counseled on 150 minutes of exercise per week as tolerated, healthy eating (including decreased daily intake of saturated fats, cholesterol, added sugars, sodium), STI prevention, and routine healthcare maintenance.  2. Screening for metabolic disorder: - CMP to check kidney function, liver function, and electrolyte balance.  - Comprehensive metabolic panel  3. Screening for deficiency anemia: - CBC to screen for anemia. - CBC  4. Diabetes mellitus  screening: - Hemoglobin A1c to screen for pre-diabetes/diabetes. - Hemoglobin A1c  5. Screening cholesterol level: - Lipid panel to screen for high cholesterol.  - Lipid panel  6. Thyroid disorder screen: - TSH to check thyroid function.  - TSH+T4F+T3Free  7. Need for hepatitis C screening test: - Hepatitis C antibody to screen for hepatitis C.  - Hepatitis C Antibody  8. Encounter for screening for HIV: - HIV antibody to screen for human immunodeficiency virus.  - HIV antibody (with reflex)   Patient was given the opportunity to ask questions.  Patient verbalized understanding of the plan and was able to repeat key elements of the plan. Patient was given clear instructions to go to Emergency Department or return to medical center if symptoms don't improve, worsen, or new problems develop.The patient verbalized understanding.   Orders Placed This Encounter  Procedures  . Hepatitis C Antibody  . HIV antibody (with reflex)  . CBC  . Comprehensive metabolic panel  . Lipid panel  . TSH+T4F+T3Free  . Hemoglobin A1c     Requested Prescriptions    No prescriptions requested or ordered in this encounter    Follow-up with primary provider as scheduled.   Rema Fendt, NP

## 2021-04-01 ENCOUNTER — Other Ambulatory Visit: Payer: Self-pay

## 2021-04-01 ENCOUNTER — Encounter: Payer: Self-pay | Admitting: Family

## 2021-04-01 ENCOUNTER — Ambulatory Visit (INDEPENDENT_AMBULATORY_CARE_PROVIDER_SITE_OTHER): Payer: No Typology Code available for payment source | Admitting: Family

## 2021-04-01 VITALS — BP 135/87 | HR 85 | Ht 66.69 in | Wt 195.0 lb

## 2021-04-01 DIAGNOSIS — Z13228 Encounter for screening for other metabolic disorders: Secondary | ICD-10-CM

## 2021-04-01 DIAGNOSIS — Z131 Encounter for screening for diabetes mellitus: Secondary | ICD-10-CM

## 2021-04-01 DIAGNOSIS — Z1329 Encounter for screening for other suspected endocrine disorder: Secondary | ICD-10-CM

## 2021-04-01 DIAGNOSIS — Z13220 Encounter for screening for lipoid disorders: Secondary | ICD-10-CM

## 2021-04-01 DIAGNOSIS — Z0000 Encounter for general adult medical examination without abnormal findings: Secondary | ICD-10-CM | POA: Diagnosis not present

## 2021-04-01 DIAGNOSIS — Z1159 Encounter for screening for other viral diseases: Secondary | ICD-10-CM

## 2021-04-01 DIAGNOSIS — Z130 Encounter for screening for diseases of the blood and blood-forming organs and certain disorders involving the immune mechanism: Secondary | ICD-10-CM

## 2021-04-01 DIAGNOSIS — Z114 Encounter for screening for human immunodeficiency virus [HIV]: Secondary | ICD-10-CM

## 2021-04-01 NOTE — Patient Instructions (Signed)
 Annual physical exam and labs today.   Follow-up with primary provider as scheduled. Preventive Care 45-45 Years Old, Female Preventive care refers to lifestyle choices and visits with your health care provider that can promote health and wellness. This includes:  A yearly physical exam. This is also called an annual wellness visit.  Regular dental and eye exams.  Immunizations.  Screening for certain conditions.  Healthy lifestyle choices, such as: ? Eating a healthy diet. ? Getting regular exercise. ? Not using drugs or products that contain nicotine and tobacco. ? Limiting alcohol use. What can I expect for my preventive care visit? Physical exam Your health care provider will check your:  Height and weight. These may be used to calculate your BMI (body mass index). BMI is a measurement that tells if you are at a healthy weight.  Heart rate and blood pressure.  Body temperature.  Skin for abnormal spots. Counseling Your health care provider may ask you questions about your:  Past medical problems.  Family's medical history.  Alcohol, tobacco, and drug use.  Emotional well-being.  Home life and relationship well-being.  Sexual activity.  Diet, exercise, and sleep habits.  Work and work environment.  Access to firearms.  Method of birth control.  Menstrual cycle.  Pregnancy history. What immunizations do I need? Vaccines are usually given at various ages, according to a schedule. Your health care provider will recommend vaccines for you based on your age, medical history, and lifestyle or other factors, such as travel or where you work.   What tests do I need? Blood tests  Lipid and cholesterol levels. These may be checked every 5 years, or more often if you are over 45 years old.  Hepatitis C test.  Hepatitis B test. Screening  Lung cancer screening. You may have this screening every year starting at age 55 if you have a 30-pack-year history of  smoking and currently smoke or have quit within the past 15 years.  Colorectal cancer screening. ? All adults should have this screening starting at age 45 and continuing until age 75. ? Your health care provider may recommend screening at age 45 if you are at increased risk. ? You will have tests every 1-10 years, depending on your results and the type of screening test.  Diabetes screening. ? This is done by checking your blood sugar (glucose) after you have not eaten for a while (fasting). ? You may have this done every 1-3 years.  Mammogram. ? This may be done every 1-2 years. ? Talk with your health care provider about when you should start having regular mammograms. This may depend on whether you have a family history of breast cancer.  BRCA-related cancer screening. This may be done if you have a family history of breast, ovarian, tubal, or peritoneal cancers.  Pelvic exam and Pap test. ? This may be done every 3 years starting at age 45. ? Starting at age 30, this may be done every 5 years if you have a Pap test in combination with an HPV test. Other tests  STD (sexually transmitted disease) testing, if you are at risk.  Bone density scan. This is done to screen for osteoporosis. You may have this scan if you are at high risk for osteoporosis. Talk with your health care provider about your test results, treatment options, and if necessary, the need for more tests. Follow these instructions at home: Eating and drinking  Eat a diet that includes fresh fruits and   vegetables, whole grains, lean protein, and low-fat dairy products.  Take vitamin and mineral supplements as recommended by your health care provider.  Do not drink alcohol if: ? Your health care provider tells you not to drink. ? You are pregnant, may be pregnant, or are planning to become pregnant.  If you drink alcohol: ? Limit how much you have to 0-1 drink a day. ? Be aware of how much alcohol is in your  drink. In the [place] one drink equals one 12 oz bottle of beer (355 mL), one 5 oz glass of wine (148 mL), or one 1 oz glass of hard liquor (44 mL).   Lifestyle  Take daily care of your teeth and gums. Brush your teeth every morning and night with fluoride toothpaste. Floss one time each day.  Stay active. Exercise for at least 30 minutes 5 or more days each week.  Do not use any products that contain nicotine or tobacco, such as cigarettes, e-cigarettes, and chewing tobacco. If you need help quitting, ask your health care provider.  Do not use drugs.  If you are sexually active, practice safe sex. Use a condom or other form of protection to prevent STIs (sexually transmitted infections).  If you do not wish to become pregnant, use a form of birth control. If you plan to become pregnant, see your health care provider for a prepregnancy visit.  If told by your health care provider, take low-dose aspirin daily starting at age 50.  Find healthy ways to cope with stress, such as: ? Meditation, yoga, or listening to music. ? Journaling. ? Talking to a trusted person. ? Spending time with friends and family. Safety  Always wear your seat belt while driving or riding in a vehicle.  Do not drive: ? If you have been drinking alcohol. Do not ride with someone who has been drinking. ? When you are tired or distracted. ? While texting.  Wear a helmet and other protective equipment during sports activities.  If you have firearms in your house, make sure you follow all gun safety procedures. What's next?  Visit your health care provider once a year for an annual wellness visit.  Ask your health care provider how often you should have your eyes and teeth checked.  Stay up to date on all vaccines. This information is not intended to replace advice given to you by your health care provider. Make sure you discuss any questions you have with your health care provider. Document Revised:  09/02/2020 Document Reviewed: 08/10/2018 Elsevier Patient Education  2021 Elsevier Inc.  

## 2021-04-01 NOTE — Progress Notes (Signed)
Physical  Gyn visit completed 02/10/21, mammogram scheduled for 5/3

## 2021-04-02 LAB — COMPREHENSIVE METABOLIC PANEL
ALT: 11 IU/L (ref 0–32)
AST: 17 IU/L (ref 0–40)
Albumin/Globulin Ratio: 1.2 (ref 1.2–2.2)
Albumin: 4 g/dL (ref 3.8–4.8)
Alkaline Phosphatase: 61 IU/L (ref 44–121)
BUN/Creatinine Ratio: 14 (ref 9–23)
BUN: 10 mg/dL (ref 6–24)
Bilirubin Total: <0.2 mg/dL (ref 0.0–1.2)
CO2: 20 mmol/L (ref 20–29)
Calcium: 9.6 mg/dL (ref 8.7–10.2)
Chloride: 102 mmol/L (ref 96–106)
Creatinine, Ser: 0.73 mg/dL (ref 0.57–1.00)
Globulin, Total: 3.3 g/dL (ref 1.5–4.5)
Glucose: 85 mg/dL (ref 65–99)
Potassium: 4.6 mmol/L (ref 3.5–5.2)
Sodium: 140 mmol/L (ref 134–144)
Total Protein: 7.3 g/dL (ref 6.0–8.5)
eGFR: 104 mL/min/{1.73_m2} (ref >59)

## 2021-04-02 LAB — TSH+T4F+T3FREE
Free T4: 1.2 ng/dL (ref 0.82–1.77)
T3, Free: 3 pg/mL (ref 2.0–4.4)
TSH: 1.35 u[IU]/mL (ref 0.450–4.500)

## 2021-04-02 LAB — LIPID PANEL
Chol/HDL Ratio: 2.4 ratio (ref 0.0–4.4)
Cholesterol, Total: 194 mg/dL (ref 100–199)
HDL: 82 mg/dL (ref >39)
LDL Chol Calc (NIH): 102 mg/dL — ABNORMAL HIGH (ref 0–99)
Triglycerides: 53 mg/dL (ref 0–149)
VLDL Cholesterol Cal: 10 mg/dL (ref 5–40)

## 2021-04-02 LAB — CBC
Hematocrit: 36.6 % (ref 34.0–46.6)
Hemoglobin: 11.4 g/dL (ref 11.1–15.9)
MCH: 27 pg (ref 26.6–33.0)
MCHC: 31.1 g/dL — ABNORMAL LOW (ref 31.5–35.7)
MCV: 87 fL (ref 79–97)
Platelets: 416 10*3/uL (ref 150–450)
RBC: 4.23 x10E6/uL (ref 3.77–5.28)
RDW: 14.6 % (ref 11.7–15.4)
WBC: 5.2 10*3/uL (ref 3.4–10.8)

## 2021-04-02 LAB — HEMOGLOBIN A1C
Est. average glucose Bld gHb Est-mCnc: 120 mg/dL
Hgb A1c MFr Bld: 5.8 % — ABNORMAL HIGH (ref 4.8–5.6)

## 2021-04-02 LAB — HEPATITIS C ANTIBODY: Hep C Virus Ab: <0.1 s/co ratio (ref 0.0–0.9)

## 2021-04-02 LAB — HIV ANTIBODY (ROUTINE TESTING W REFLEX): HIV Screen 4th Generation wRfx: NONREACTIVE

## 2021-04-02 NOTE — Progress Notes (Signed)
Kidney function normal.   Liver function normal.   Thyroid function normal.   No anemia.   Hepatitis C negative.   HIV negative.   Your hemoglobin A1c is consistent with pre-diabetes. Practice healthy eating habits of fresh fruit and vegetables, lean baked meats such as chicken, fish, and [place]; limit breads, rice, pastas, and desserts; practice regular aerobic exercise (at least 150 minutes a week as tolerated). Patient encouraged to have rechecked in 6 months.   LDL cholesterol, sometimes called "bad cholesterol", mildly higher than expected. High cholesterol may increase risk of heart attack and/or stroke. Consider eating more fruits, vegetables, and lean baked meats such as chicken or fish. Moderate intensity exercise at least 150 minutes as tolerated per week may help as well. Patient encouraged to have rechecked in 6 months.   The following is for provider reference only: The 10-year ASCVD risk score is: 0.5%   Values used to calculate the score:     Age: 46 years     Sex: Female     Is Non-Hispanic African American: Yes     Diabetic: No     Tobacco smoker: No     Systolic Blood Pressure: 135 mmHg     Is BP treated: No     HDL Cholesterol: 82 mg/dL     Total Cholesterol: 194 mg/dL

## 2021-08-21 ENCOUNTER — Other Ambulatory Visit: Payer: Self-pay

## 2022-03-15 ENCOUNTER — Ambulatory Visit: Payer: Self-pay

## 2022-03-15 NOTE — Telephone Encounter (Signed)
? ? ?  Chief Complaint: Sinus pain, pressure ?Symptoms: Yellow mucus ?Frequency: 3 weeks ago ?Pertinent Negatives: Patient denies fever ?Disposition: [] ED /[] Urgent Care (no appt availability in office) / [] Appointment(In office/virtual)/ []  [facility]/ [] Home Care/ [] Refused Recommended Disposition /[] [facility]/ []  Follow-up with PCP ?Additional Notes: Asking for appointment or "antibiotic to be called in." Please advise pt.  ?Answer Assessment - Initial Assessment Questions ?1. LOCATION: "Where does it hurt?"  ?    Headache ?2. ONSET: "When did the sinus pain start?"  (e.g., hours, days)  ?    3 weeks ago ?3. SEVERITY: "How bad is the pain?"   (Scale 1-10; mild, moderate or severe) ?  - MILD (1-3): doesn't interfere with normal activities  ?  - MODERATE (4-7): interferes with normal activities (e.g., work or school) or awakens from sleep ?  - SEVERE (8-10): excruciating pain and patient unable to do any normal activities    ?    Moderate ?4. RECURRENT SYMPTOM: "Have you ever had sinus problems before?" If Yes, ask: "When was the last time?" and "What happened that time?"  ?    Yes ?5. NASAL CONGESTION: "Is the nose blocked?" If Yes, ask: "Can you open it or must you breathe through your mouth?" ?    Yes ?6. NASAL DISCHARGE: "Do you have discharge from your nose?" If so ask, "What color?" ?    Yellow ?7. FEVER: "Do you have a fever?" If Yes, ask: "What is it, how was it measured, and when did it start?"  ?    No ?8. OTHER SYMPTOMS: "Do you have any other symptoms?" (e.g., sore throat, cough, earache, difficulty breathing) ?    Ear pain ?9. PREGNANCY: "Is there any chance you are pregnant?" "When was your last menstrual period?" ?    No ? ?Protocols used: Sinus Pain or Congestion-A-AH ? ?

## 2023-04-11 DIAGNOSIS — Z01419 Encounter for gynecological examination (general) (routine) without abnormal findings: Secondary | ICD-10-CM | POA: Diagnosis not present

## 2023-04-11 DIAGNOSIS — Z114 Encounter for screening for human immunodeficiency virus [HIV]: Secondary | ICD-10-CM | POA: Diagnosis not present

## 2023-04-11 DIAGNOSIS — Z113 Encounter for screening for infections with a predominantly sexual mode of transmission: Secondary | ICD-10-CM | POA: Diagnosis not present

## 2023-04-11 DIAGNOSIS — Z1159 Encounter for screening for other viral diseases: Secondary | ICD-10-CM | POA: Diagnosis not present

## 2023-04-27 NOTE — Progress Notes (Signed)
Patient ID: [name], female    DOB: 05-22-1976  MRN: [id number]  CC: Annual Physical Exam  Subjective: Karla Krause is a 47 y.o. female who presents for annual physical exam.  Her concerns today include:  - Requests Flonase for seasonal allergies.  - Reports pap up to date with Dr. Cherly Krause.  - Reports mammogram up to date with [facility]. - No further issues/concerns for discussion today.  Patient Active Problem List   Diagnosis Date Noted   Cervical radiculitis 01/22/2020     Current Outpatient Medications on File Prior to Visit  Medication Sig Dispense Refill   Multiple Vitamin (MULTIVITAMIN) tablet Take 1 tablet by mouth daily.     No current facility-administered medications on file prior to visit.    No Known Allergies  Social History   Socioeconomic History   Marital status: Married    Spouse name: Not on file   Number of children: Not on file   Years of education: Not on file   Highest education level: Not on file  Occupational History   Not on file  Tobacco Use   Smoking status: Never   Smokeless tobacco: Never  Vaping Use   Vaping Use: Never used  Substance and Sexual Activity   Alcohol use: Not Currently   Drug use: Never   Sexual activity: Not on file  Other Topics Concern   Not on file  Social History Narrative   Not on file   Social Determinants of Health   Financial Resource Strain: Not on file  Food Insecurity: Not on file  Transportation Needs: Not on file  Physical Activity: Not on file  Stress: Not on file  Social Connections: Not on file  Intimate Partner Violence: Not on file    No family history on file.  No past surgical history on file.  ROS: Review of Systems Negative except as stated above  PHYSICAL EXAM: BP (!) 142/88 (BP Location: Left Arm, Patient Position: Sitting, Cuff Size: Normal)   Pulse 74   Wt 194 lb 9.6 oz (88.3 kg)   SpO2 98%   BMI 30.76 kg/m   Physical Exam HENT:     Head:  Normocephalic and atraumatic.     Right Ear: Tympanic membrane, ear canal and external ear normal.     Left Ear: Tympanic membrane, ear canal and external ear normal.     Nose: Nose normal.     Mouth/Throat:     Mouth: Mucous membranes are moist.     Pharynx: Oropharynx is clear.  Eyes:     Extraocular Movements: Extraocular movements intact.     Conjunctiva/sclera: Conjunctivae normal.     Pupils: Pupils are equal, round, and reactive to light.  Cardiovascular:     Rate and Rhythm: Normal rate and regular rhythm.     Pulses: Normal pulses.     Heart sounds: Normal heart sounds.  Pulmonary:     Effort: Pulmonary effort is normal.     Breath sounds: Normal breath sounds.  Chest:     Comments: Patient declined.  Abdominal:     General: Bowel sounds are normal.     Palpations: Abdomen is soft.  Genitourinary:    Comments: Patient declined.  Musculoskeletal:        General: Normal range of motion.     Right shoulder: Normal.     Left shoulder: Normal.     Right upper arm: Normal.     Left upper arm: Normal.  Right elbow: Normal.     Left elbow: Normal.     Right forearm: Normal.     Left forearm: Normal.     Right wrist: Normal.     Left wrist: Normal.     Right hand: Normal.     Left hand: Normal.     Cervical back: Normal, normal range of motion and neck supple.     Thoracic back: Normal.     Lumbar back: Normal.     Right hip: Normal.     Left hip: Normal.     Right upper leg: Normal.     Left upper leg: Normal.     Right knee: Normal.     Left knee: Normal.     Right lower leg: Normal.     Left lower leg: Normal.     Right ankle: Normal.     Left ankle: Normal.     Right foot: Normal.     Left foot: Normal.  Skin:    General: Skin is warm and dry.     Capillary Refill: Capillary refill takes less than 2 seconds.  Neurological:     General: No focal deficit present.     Mental Status: She is alert and oriented to person, place, and time.  Psychiatric:         Mood and Affect: Mood normal.        Behavior: Behavior normal.       ASSESSMENT AND PLAN: 1. Annual physical exam - Counseled on 150 minutes of exercise per week as tolerated, healthy eating (including decreased daily intake of saturated fats, cholesterol, added sugars, sodium), STI prevention, and routine healthcare maintenance.  2. Screening for metabolic disorder - Routine screening.  - CMP14+EGFR  3. Screening for deficiency anemia - Routine screening.  - CBC  4. History of prediabetes - Routine screening.  - Hemoglobin A1c  5. Screening cholesterol level - Routine screening.  - Lipid panel  6. Thyroid disorder screen - Routine screening.  - TSH  7. Encounter for screening mammogram for malignant neoplasm of breast - Patient reports up to date.  8. Pap smear for cervical cancer screening 9. Routine screening for STI (sexually transmitted infection) - Patient reports up to date.  10. Colon cancer screening - Referral to Gastroenterology for colon cancer screening by colonoscopy. - Ambulatory referral to Gastroenterology  11. Perennial allergic rhinitis - Fluticasone as prescribed. Counseled on medication adherence/adverse effects.  - Follow-up with primary provider as scheduled.  - fluticasone (FLONASE) 50 MCG/ACT nasal spray; Place 2 sprays into both nostrils daily.  Dispense: 16 g; Refill: 1  12. Elevated blood pressure reading in office without diagnosis of hypertension - Blood pressure not at goal during today's visit. Patient asymptomatic without chest pressure, chest pain, palpitations, shortness of breath, worst headache of life, and any additional red flag symptoms. - Follow-up with primary provider in 4 weeks or sooner if needed for blood pressure check.   Patient was given the opportunity to ask questions.  Patient verbalized understanding of the plan and was able to repeat key elements of the plan. Patient was given clear instructions to go to  Emergency Department or return to medical center if symptoms don't improve, worsen, or new problems develop.The patient verbalized understanding.   Orders Placed This Encounter  Procedures   CBC   Lipid panel   TSH   CMP14+EGFR   Hemoglobin A1c   Ambulatory referral to Gastroenterology     Requested Prescriptions  Signed Prescriptions Disp Refills   fluticasone (FLONASE) 50 MCG/ACT nasal spray 16 g 1    Sig: Place 2 sprays into both nostrils daily.    Return in about 1 year (around 04/28/2024) for Physical per patient preference.  Rema Fendt, NP

## 2023-04-29 ENCOUNTER — Other Ambulatory Visit (HOSPITAL_COMMUNITY): Payer: Self-pay

## 2023-04-29 ENCOUNTER — Ambulatory Visit (INDEPENDENT_AMBULATORY_CARE_PROVIDER_SITE_OTHER): Payer: 59 | Admitting: Family

## 2023-04-29 ENCOUNTER — Encounter: Payer: Self-pay | Admitting: Family

## 2023-04-29 VITALS — BP 142/88 | HR 74 | Wt 194.6 lb

## 2023-04-29 DIAGNOSIS — Z113 Encounter for screening for infections with a predominantly sexual mode of transmission: Secondary | ICD-10-CM | POA: Diagnosis not present

## 2023-04-29 DIAGNOSIS — Z0000 Encounter for general adult medical examination without abnormal findings: Secondary | ICD-10-CM

## 2023-04-29 DIAGNOSIS — Z124 Encounter for screening for malignant neoplasm of cervix: Secondary | ICD-10-CM

## 2023-04-29 DIAGNOSIS — Z13228 Encounter for screening for other metabolic disorders: Secondary | ICD-10-CM

## 2023-04-29 DIAGNOSIS — J3089 Other allergic rhinitis: Secondary | ICD-10-CM

## 2023-04-29 DIAGNOSIS — R030 Elevated blood-pressure reading, without diagnosis of hypertension: Secondary | ICD-10-CM

## 2023-04-29 DIAGNOSIS — Z13220 Encounter for screening for lipoid disorders: Secondary | ICD-10-CM

## 2023-04-29 DIAGNOSIS — Z1329 Encounter for screening for other suspected endocrine disorder: Secondary | ICD-10-CM | POA: Diagnosis not present

## 2023-04-29 DIAGNOSIS — Z87898 Personal history of other specified conditions: Secondary | ICD-10-CM | POA: Diagnosis not present

## 2023-04-29 DIAGNOSIS — Z1231 Encounter for screening mammogram for malignant neoplasm of breast: Secondary | ICD-10-CM

## 2023-04-29 DIAGNOSIS — Z1211 Encounter for screening for malignant neoplasm of colon: Secondary | ICD-10-CM

## 2023-04-29 DIAGNOSIS — Z130 Encounter for screening for diseases of the blood and blood-forming organs and certain disorders involving the immune mechanism: Secondary | ICD-10-CM | POA: Diagnosis not present

## 2023-04-29 MED ORDER — FLUTICASONE PROPIONATE 50 MCG/ACT NA SUSP
2.0000 | Freq: Every day | NASAL | 1 refills | Status: DC
  Filled 2023-04-29 – 2023-05-04 (×2): qty 16, 30d supply, fill #0
  Filled 2024-02-16: qty 16, 30d supply, fill #1

## 2023-04-29 NOTE — Patient Instructions (Signed)
Preventive Care 40-47 Years Old, Female Preventive care refers to lifestyle choices and visits with your health care provider that can promote health and wellness. Preventive care visits are also called wellness exams. What can I expect for my preventive care visit? Counseling Your health care provider may ask you questions about your: Medical history, including: Past medical problems. Family medical history. Pregnancy history. Current health, including: Menstrual cycle. Method of birth control. Emotional well-being. Home life and relationship well-being. Sexual activity and sexual health. Lifestyle, including: Alcohol, nicotine or tobacco, and drug use. Access to firearms. Diet, exercise, and sleep habits. Work and work environment. Sunscreen use. Safety issues such as seatbelt and bike helmet use. Physical exam Your health care provider will check your: Height and weight. These may be used to calculate your BMI (body mass index). BMI is a measurement that tells if you are at a healthy weight. Waist circumference. This measures the distance around your waistline. This measurement also tells if you are at a healthy weight and may help predict your risk of certain diseases, such as type 2 diabetes and high blood pressure. Heart rate and blood pressure. Body temperature. Skin for abnormal spots. What immunizations do I need?  Vaccines are usually given at various ages, according to a schedule. Your health care provider will recommend vaccines for you based on your age, medical history, and lifestyle or other factors, such as travel or where you work. What tests do I need? Screening Your health care provider may recommend screening tests for certain conditions. This may include: Lipid and cholesterol levels. Diabetes screening. This is done by checking your blood sugar (glucose) after you have not eaten for a while (fasting). Pelvic exam and Pap test. Hepatitis B test. Hepatitis C  test. HIV (human immunodeficiency virus) test. STI (sexually transmitted infection) testing, if you are at risk. Lung cancer screening. Colorectal cancer screening. Mammogram. Talk with your health care provider about when you should start having regular mammograms. This may depend on whether you have a family history of breast cancer. BRCA-related cancer screening. This may be done if you have a family history of breast, ovarian, tubal, or peritoneal cancers. Bone density scan. This is done to screen for osteoporosis. Talk with your health care provider about your test results, treatment options, and if necessary, the need for more tests. Follow these instructions at home: Eating and drinking  Eat a diet that includes fresh fruits and vegetables, whole grains, lean protein, and low-fat dairy products. Take vitamin and mineral supplements as recommended by your health care provider. Do not drink alcohol if: Your health care provider tells you not to drink. You are pregnant, may be pregnant, or are planning to become pregnant. If you drink alcohol: Limit how much you have to 0-1 drink a day. Know how much alcohol is in your drink. In the [place] one drink equals one 12 oz bottle of beer (355 mL), one 5 oz glass of wine (148 mL), or one 1 oz glass of hard liquor (44 mL). Lifestyle Brush your teeth every morning and night with fluoride toothpaste. Floss one time each day. Exercise for at least 30 minutes 5 or more days each week. Do not use any products that contain nicotine or tobacco. These products include cigarettes, chewing tobacco, and vaping devices, such as e-cigarettes. If you need help quitting, ask your health care provider. Do not use drugs. If you are sexually active, practice safe sex. Use a condom or other form of protection to   prevent STIs. If you do not wish to become pregnant, use a form of birth control. If you plan to become pregnant, see your health care provider for a  prepregnancy visit. Take aspirin only as told by your health care provider. Make sure that you understand how much to take and what form to take. Work with your health care provider to find out whether it is safe and beneficial for you to take aspirin daily. Find healthy ways to manage stress, such as: Meditation, yoga, or listening to music. Journaling. Talking to a trusted person. Spending time with friends and family. Minimize exposure to UV radiation to reduce your risk of skin cancer. Safety Always wear your seat belt while driving or riding in a vehicle. Do not drive: If you have been drinking alcohol. Do not ride with someone who has been drinking. When you are tired or distracted. While texting. If you have been using any mind-altering substances or drugs. Wear a helmet and other protective equipment during sports activities. If you have firearms in your house, make sure you follow all gun safety procedures. Seek help if you have been physically or sexually abused. What's next? Visit your health care provider once a year for an annual wellness visit. Ask your health care provider how often you should have your eyes and teeth checked. Stay up to date on all vaccines. This information is not intended to replace advice given to you by your health care provider. Make sure you discuss any questions you have with your health care provider. Document Revised: 05/27/2021 Document Reviewed: 05/27/2021 Elsevier Patient Education  2023 [employer] Inc[employer]  

## 2023-04-29 NOTE — Progress Notes (Signed)
Patient is here for her physical, patient states that she has already had her pap and mammogram

## 2023-04-30 LAB — LIPID PANEL
Chol/HDL Ratio: 2.5 ratio (ref 0.0–4.4)
Cholesterol, Total: 217 mg/dL — ABNORMAL HIGH (ref 100–199)
HDL: 88 mg/dL (ref >39)
LDL Chol Calc (NIH): 121 mg/dL — ABNORMAL HIGH (ref 0–99)
Triglycerides: 43 mg/dL (ref 0–149)
VLDL Cholesterol Cal: 8 mg/dL (ref 5–40)

## 2023-04-30 LAB — CBC
Hematocrit: 31 % — ABNORMAL LOW (ref 34.0–46.6)
Hemoglobin: 9.5 g/dL — ABNORMAL LOW (ref 11.1–15.9)
MCH: 23.5 pg — ABNORMAL LOW (ref 26.6–33.0)
MCHC: 30.6 g/dL — ABNORMAL LOW (ref 31.5–35.7)
MCV: 77 fL — ABNORMAL LOW (ref 79–97)
Platelets: 409 10*3/uL (ref 150–450)
RBC: 4.05 x10E6/uL (ref 3.77–5.28)
RDW: 16.8 % — ABNORMAL HIGH (ref 11.7–15.4)
WBC: 6.6 10*3/uL (ref 3.4–10.8)

## 2023-04-30 LAB — HEMOGLOBIN A1C
Est. average glucose Bld gHb Est-mCnc: 123 mg/dL
Hgb A1c MFr Bld: 5.9 % — ABNORMAL HIGH (ref 4.8–5.6)

## 2023-04-30 LAB — CMP14+EGFR
ALT: 8 IU/L (ref 0–32)
AST: 14 IU/L (ref 0–40)
Albumin/Globulin Ratio: 1.5 (ref 1.2–2.2)
Albumin: 4.4 g/dL (ref 3.9–4.9)
Alkaline Phosphatase: 76 IU/L (ref 44–121)
BUN/Creatinine Ratio: 14 (ref 9–23)
BUN: 9 mg/dL (ref 6–24)
Bilirubin Total: <0.2 mg/dL (ref 0.0–1.2)
CO2: 18 mmol/L — ABNORMAL LOW (ref 20–29)
Calcium: 9.3 mg/dL (ref 8.7–10.2)
Chloride: 102 mmol/L (ref 96–106)
Creatinine, Ser: 0.63 mg/dL (ref 0.57–1.00)
Globulin, Total: 3 g/dL (ref 1.5–4.5)
Glucose: 82 mg/dL (ref 70–99)
Potassium: 4.4 mmol/L (ref 3.5–5.2)
Sodium: 139 mmol/L (ref 134–144)
Total Protein: 7.4 g/dL (ref 6.0–8.5)
eGFR: 111 mL/min/{1.73_m2} (ref >59)

## 2023-04-30 LAB — TSH: TSH: 1.68 u[IU]/mL (ref 0.450–4.500)

## 2023-05-02 ENCOUNTER — Other Ambulatory Visit (HOSPITAL_COMMUNITY): Payer: Self-pay

## 2023-05-02 ENCOUNTER — Other Ambulatory Visit: Payer: Self-pay | Admitting: Family

## 2023-05-02 ENCOUNTER — Encounter: Payer: Self-pay | Admitting: Family

## 2023-05-02 DIAGNOSIS — D649 Anemia, unspecified: Secondary | ICD-10-CM | POA: Insufficient documentation

## 2023-05-02 DIAGNOSIS — D509 Iron deficiency anemia, unspecified: Secondary | ICD-10-CM

## 2023-05-02 DIAGNOSIS — R7303 Prediabetes: Secondary | ICD-10-CM | POA: Insufficient documentation

## 2023-05-02 MED ORDER — IRON (FERROUS SULFATE) 325 (65 FE) MG PO TABS
325.0000 mg | ORAL_TABLET | Freq: Every day | ORAL | 0 refills | Status: DC
  Filled 2023-05-02: qty 90, fill #0

## 2023-05-04 ENCOUNTER — Other Ambulatory Visit (HOSPITAL_COMMUNITY): Payer: Self-pay

## 2023-05-04 ENCOUNTER — Telehealth: Payer: Self-pay

## 2023-05-04 NOTE — Telephone Encounter (Signed)
Pt was called and is aware of results, DOB was confirmed.  ?

## 2023-05-04 NOTE — Telephone Encounter (Signed)
-----   Message from Rema Fendt, NP sent at 05/02/2023  8:01 AM EDT ----- Call patient with update.   - Kidney function normal. - Liver function normal.  - Thyroid function normal.   The following abnormalities are noted:   - Hemoglobin A1c is consistent with prediabetes. Practice healthy eating habits of fresh fruit and vegetables, lean baked meats such as chicken, fish, and [place]; limit breads, rice, pastas, and desserts; practice regular aerobic exercise (at least 150 minutes a week as tolerated). - Cholesterol mildly higher than expected. High cholesterol may increase risk of heart attack and/or stroke. Consider eating more fruits, vegetables, and lean baked meats such as chicken or fish. Moderate intensity exercise at least 150 minutes as tolerated per week may help as well.  - Mild anemia. Increase iron-rich foods such as dark green leafy vegetables and dried fruit such as raisins and apricots.    All other values are normal, stable or within acceptable limits.  Medication changes / Follow up labs / Other changes or recommendations:   - Please schedule appointment for prediabetes checkup in 6 months.  - Please schedule appointment to recheck fasting cholesterol in 3 to 6 months.  - Begin Ferrous Sulfate for anemia. Please schedule appointment to recheck anemia in 4 to 6 weeks.

## 2023-06-15 ENCOUNTER — Other Ambulatory Visit: Payer: 59

## 2023-06-15 DIAGNOSIS — D509 Iron deficiency anemia, unspecified: Secondary | ICD-10-CM | POA: Diagnosis not present

## 2023-06-16 LAB — CBC
Hematocrit: 32.5 % — ABNORMAL LOW (ref 34.0–46.6)
Hemoglobin: 10 g/dL — ABNORMAL LOW (ref 11.1–15.9)
MCH: 25.4 pg — ABNORMAL LOW (ref 26.6–33.0)
MCHC: 30.8 g/dL — ABNORMAL LOW (ref 31.5–35.7)
MCV: 83 fL (ref 79–97)
Platelets: 425 10*3/uL (ref 150–450)
RBC: 3.93 x10E6/uL (ref 3.77–5.28)
RDW: 17.6 % — ABNORMAL HIGH (ref 11.7–15.4)
WBC: 5.2 10*3/uL (ref 3.4–10.8)

## 2023-06-17 ENCOUNTER — Other Ambulatory Visit (HOSPITAL_COMMUNITY): Payer: Self-pay

## 2023-06-17 ENCOUNTER — Other Ambulatory Visit: Payer: Self-pay | Admitting: Family

## 2023-06-17 DIAGNOSIS — D509 Iron deficiency anemia, unspecified: Secondary | ICD-10-CM

## 2023-06-17 MED ORDER — IRON (FERROUS SULFATE) 325 (65 FE) MG PO TABS
1.0000 | ORAL_TABLET | Freq: Every day | ORAL | 0 refills | Status: DC
  Filled 2023-06-17: qty 90, 90d supply, fill #0

## 2023-06-25 ENCOUNTER — Other Ambulatory Visit (HOSPITAL_COMMUNITY): Payer: Self-pay

## 2023-06-27 ENCOUNTER — Ambulatory Visit: Payer: Self-pay

## 2023-06-27 NOTE — Telephone Encounter (Signed)
Pt called reporting that she has a potential sinus infection, she has had symptoms since last Wednesday. No appt available.   Congestion, sinus pressure/soreness, feels terrible, blowing her nose.   Best contact: [phone]    Chief Complaint: Sinus pain, pressure. Has tried OTC medications Symptoms: Above, yellow mucus Frequency: Last week Pertinent Negatives: Patient denies fever Disposition: [] ED /[] Urgent Care (no appt availability in office) / [] Appointment(In office/virtual)/ []  [facility]/ [] Home Care/ [] Refused Recommended Disposition /[x] [facility]/ []  Follow-up with PCP Additional Notes: Pt. Going to [facility].  Reason for Disposition  [1] SEVERE pain AND [2] not improved 2 hours after pain medicine  Answer Assessment - Initial Assessment Questions 1. LOCATION: "Where does it hurt?"      [place] 2. ONSET: "When did the sinus pain start?"  (e.g., hours, days)      Last week 3. SEVERITY: "How bad is the pain?"   (Scale 1-10; mild, moderate or severe)   - MILD (1-3): doesn't interfere with normal activities    - MODERATE (4-7): interferes with normal activities (e.g., work or school) or awakens from sleep   - SEVERE (8-10): excruciating pain and patient unable to do any normal activities        Moderate 4. RECURRENT SYMPTOM: "Have you ever had sinus problems before?" If Yes, ask: "When was the last time?" and "What happened that time?"      Yes 5. NASAL CONGESTION: "Is the nose blocked?" If Yes, ask: "Can you open it or must you breathe through your mouth?"     Yes 6. NASAL DISCHARGE: "Do you have discharge from your nose?" If so ask, "What color?"     Yellow 7. FEVER: "Do you have a fever?" If Yes, ask: "What is it, how was it measured, and when did it start?"      No 8. OTHER SYMPTOMS: "Do you have any other symptoms?" (e.g., sore throat, cough, earache, difficulty breathing)     No 9. PREGNANCY: "Is there any chance you are pregnant?" "When  was your last menstrual period?"     No  Protocols used: Sinus Pain or Congestion-A-AH

## 2023-09-12 ENCOUNTER — Ambulatory Visit (INDEPENDENT_AMBULATORY_CARE_PROVIDER_SITE_OTHER): Payer: 59 | Admitting: Family

## 2023-09-12 ENCOUNTER — Encounter: Payer: Self-pay | Admitting: Family

## 2023-09-12 ENCOUNTER — Other Ambulatory Visit (HOSPITAL_COMMUNITY): Payer: Self-pay

## 2023-09-12 VITALS — BP 152/99 | HR 79 | Temp 98.0°F | Ht 66.0 in | Wt 197.6 lb

## 2023-09-12 DIAGNOSIS — Z13220 Encounter for screening for lipoid disorders: Secondary | ICD-10-CM

## 2023-09-12 DIAGNOSIS — D509 Iron deficiency anemia, unspecified: Secondary | ICD-10-CM

## 2023-09-12 DIAGNOSIS — I10 Essential (primary) hypertension: Secondary | ICD-10-CM | POA: Diagnosis not present

## 2023-09-12 MED ORDER — VALSARTAN 40 MG PO TABS
40.0000 mg | ORAL_TABLET | Freq: Every day | ORAL | 1 refills | Status: DC
  Filled 2023-09-12: qty 30, 30d supply, fill #0
  Filled 2023-10-17 (×2): qty 30, 30d supply, fill #1

## 2023-09-12 MED ORDER — IRON (FERROUS SULFATE) 325 (65 FE) MG PO TABS
1.0000 | ORAL_TABLET | Freq: Every day | ORAL | 0 refills | Status: DC
  Filled 2023-09-12: qty 90, 90d supply, fill #0

## 2023-09-12 NOTE — Progress Notes (Signed)
Patient states no concerns to speak about.   Patient declined Flu vaccine.

## 2023-09-12 NOTE — Progress Notes (Signed)
Patient ID: Karla Krause, female    DOB: 04/25/1976  MRN: [id number]  CC: Follow-Up  Subjective: Karla Krause is a 47 y.o. female who presents for follow-up.  Her concerns today include:  - She does check blood pressure outside of office.  History of high blood pressure during pregnancy in the past. She does not complain of red flag symptoms such as but not limited to chest pain, shortness of breath, worst headache of life, nausea/vomiting.  - Doing well on Ferrous Sulfate, no issues/concerns.    Patient Active Problem List   Diagnosis Date Noted   Prediabetes 05/02/2023   Anemia 05/02/2023   Cervical radiculitis 01/22/2020     Current Outpatient Medications on File Prior to Visit  Medication Sig Dispense Refill   fluticasone (FLONASE) 50 MCG/ACT nasal spray Place 2 sprays into both nostrils daily. 16 g 1   Multiple Vitamin (MULTIVITAMIN) tablet Take 1 tablet by mouth daily.     No current facility-administered medications on file prior to visit.    No Known Allergies  Social History   Socioeconomic History   Marital status: Married    Spouse name: Not on file   Number of children: Not on file   Years of education: Not on file   Highest education level: Not on file  Occupational History   Not on file  Tobacco Use   Smoking status: Never   Smokeless tobacco: Never  Vaping Use   Vaping status: Never Used  Substance and Sexual Activity   Alcohol use: Not Currently   Drug use: Never   Sexual activity: Not on file  Other Topics Concern   Not on file  Social History Narrative   Not on file   Social Determinants of Health   Financial Resource Strain: Not on file  Food Insecurity: Not on file  Transportation Needs: Not on file  Physical Activity: Not on file  Stress: Not on file  Social Connections: Not on file  Intimate Partner Violence: Not on file    No family history on file.  No past surgical history on file.  ROS: Review of  Systems Negative except as stated above  PHYSICAL EXAM: BP (!) 152/99   Pulse 79   Temp 98 F (36.7 C) (Oral)   Ht 5\' 6"  (1.676 m)   Wt 197 lb 9.6 oz (89.6 kg)   LMP 08/14/2023 (Exact Date)   SpO2 98%   BMI 31.89 kg/m   Physical Exam HENT:     Head: Normocephalic and atraumatic.     Nose: Nose normal.     Mouth/Throat:     Mouth: Mucous membranes are moist.     Pharynx: Oropharynx is clear.  Eyes:     Extraocular Movements: Extraocular movements intact.     Conjunctiva/sclera: Conjunctivae normal.     Pupils: Pupils are equal, round, and reactive to light.  Cardiovascular:     Rate and Rhythm: Normal rate and regular rhythm.     Pulses: Normal pulses.     Heart sounds: Normal heart sounds.  Pulmonary:     Effort: Pulmonary effort is normal.     Breath sounds: Normal breath sounds.  Musculoskeletal:        General: Normal range of motion.     Cervical back: Normal range of motion and neck supple.  Neurological:     General: No focal deficit present.     Mental Status: She is alert and oriented to person, place, and time.  Psychiatric:        Mood and Affect: Mood normal.        Behavior: Behavior normal.     ASSESSMENT AND PLAN: 1. Primary hypertension - New onset.  - Trial Valsartan as prescribed. Counseled on medication adherence/adverse effects.  - Routine screening.  - Counseled on blood pressure goal of less than 130/80, low-sodium, DASH diet, medication compliance, and 150 minutes of moderate intensity exercise per week as tolerated. Counseled on medication adherence and adverse effects. - Follow-up with primary provider in 2 weeks or sooner if needed for blood pressure check.  - Basic Metabolic Panel - valsartan (DIOVAN) 40 MG tablet; Take 1 tablet (40 mg total) by mouth daily.  Dispense: 30 tablet; Refill: 1  2. Iron deficiency anemia, unspecified iron deficiency anemia type - Continue Ferrous Sulfate as prescribed. Counseled on medication  adherence/adverse effects.  - Routine screening.  - Follow-up with primary provider as scheduled.  - CBC - Iron, Ferrous Sulfate, 325 (65 Fe) MG TABS; Take 1 tablet by mouth daily.  Dispense: 90 tablet; Refill: 0  3. Screening cholesterol level - Routine screening.  - Lipid panel    Patient was given the opportunity to ask questions.  Patient verbalized understanding of the plan and was able to repeat key elements of the plan. Patient was given clear instructions to go to Emergency Department or return to medical center if symptoms don't improve, worsen, or new problems develop.The patient verbalized understanding.   Orders Placed This Encounter  Procedures   Lipid panel   CBC   Basic Metabolic Panel     Requested Prescriptions   Signed Prescriptions Disp Refills   valsartan (DIOVAN) 40 MG tablet 30 tablet 1    Sig: Take 1 tablet (40 mg total) by mouth daily.   Iron, Ferrous Sulfate, 325 (65 Fe) MG TABS 90 tablet 0    Sig: Take 1 tablet by mouth daily.    Return in about 2 weeks (around 09/26/2023) for Follow-Up or next available chronic conditions.  Rema Fendt, NP

## 2023-09-13 ENCOUNTER — Other Ambulatory Visit (HOSPITAL_COMMUNITY): Payer: Self-pay

## 2023-09-13 ENCOUNTER — Other Ambulatory Visit: Payer: Self-pay | Admitting: Family

## 2023-09-13 DIAGNOSIS — E785 Hyperlipidemia, unspecified: Secondary | ICD-10-CM | POA: Insufficient documentation

## 2023-09-13 LAB — CBC
Hematocrit: 41 % (ref 34.0–46.6)
Hemoglobin: 12.7 g/dL (ref 11.1–15.9)
MCH: 27.5 pg (ref 26.6–33.0)
MCHC: 31 g/dL — ABNORMAL LOW (ref 31.5–35.7)
MCV: 89 fL (ref 79–97)
Platelets: 351 10*3/uL (ref 150–450)
RBC: 4.62 x10E6/uL (ref 3.77–5.28)
RDW: 15 % (ref 11.7–15.4)
WBC: 4.9 10*3/uL (ref 3.4–10.8)

## 2023-09-13 LAB — LIPID PANEL
Chol/HDL Ratio: 2.6 {ratio} (ref 0.0–4.4)
Cholesterol, Total: 222 mg/dL — ABNORMAL HIGH (ref 100–199)
HDL: 87 mg/dL (ref >39)
LDL Chol Calc (NIH): 125 mg/dL — ABNORMAL HIGH (ref 0–99)
Triglycerides: 58 mg/dL (ref 0–149)
VLDL Cholesterol Cal: 10 mg/dL (ref 5–40)

## 2023-09-13 LAB — BASIC METABOLIC PANEL
BUN/Creatinine Ratio: 15 (ref 9–23)
BUN: 9 mg/dL (ref 6–24)
CO2: 21 mmol/L (ref 20–29)
Calcium: 9.6 mg/dL (ref 8.7–10.2)
Chloride: 102 mmol/L (ref 96–106)
Creatinine, Ser: 0.62 mg/dL (ref 0.57–1.00)
Glucose: 80 mg/dL (ref 70–99)
Potassium: 4.3 mmol/L (ref 3.5–5.2)
Sodium: 137 mmol/L (ref 134–144)
eGFR: 110 mL/min/{1.73_m2} (ref >59)

## 2023-09-13 MED ORDER — ATORVASTATIN CALCIUM 20 MG PO TABS
20.0000 mg | ORAL_TABLET | Freq: Every day | ORAL | 0 refills | Status: DC
  Filled 2023-09-13: qty 90, 90d supply, fill #0

## 2023-09-30 ENCOUNTER — Ambulatory Visit: Payer: 59 | Admitting: Family

## 2023-10-14 ENCOUNTER — Ambulatory Visit: Payer: 59 | Admitting: Family

## 2023-10-17 ENCOUNTER — Other Ambulatory Visit (HOSPITAL_COMMUNITY): Payer: Self-pay

## 2023-10-19 ENCOUNTER — Other Ambulatory Visit (HOSPITAL_COMMUNITY): Payer: Self-pay

## 2023-10-19 ENCOUNTER — Ambulatory Visit (AMBULATORY_SURGERY_CENTER): Payer: 59 | Admitting: *Deleted

## 2023-10-19 ENCOUNTER — Ambulatory Visit (INDEPENDENT_AMBULATORY_CARE_PROVIDER_SITE_OTHER): Payer: 59 | Admitting: Family

## 2023-10-19 ENCOUNTER — Encounter: Payer: Self-pay | Admitting: Internal Medicine

## 2023-10-19 ENCOUNTER — Encounter: Payer: Self-pay | Admitting: Family

## 2023-10-19 VITALS — Ht 66.0 in | Wt 190.0 lb

## 2023-10-19 VITALS — BP 145/93 | HR 76 | Temp 98.0°F | Ht 66.0 in | Wt 203.4 lb

## 2023-10-19 DIAGNOSIS — R7303 Prediabetes: Secondary | ICD-10-CM

## 2023-10-19 DIAGNOSIS — I10 Essential (primary) hypertension: Secondary | ICD-10-CM | POA: Diagnosis not present

## 2023-10-19 DIAGNOSIS — Z1211 Encounter for screening for malignant neoplasm of colon: Secondary | ICD-10-CM

## 2023-10-19 DIAGNOSIS — E785 Hyperlipidemia, unspecified: Secondary | ICD-10-CM

## 2023-10-19 MED ORDER — ONDANSETRON HCL 4 MG PO TABS
ORAL_TABLET | ORAL | 0 refills | Status: DC
  Filled 2023-10-19: qty 2, 1d supply, fill #0

## 2023-10-19 MED ORDER — NA SULFATE-K SULFATE-MG SULF 17.5-3.13-1.6 GM/177ML PO SOLN
1.0000 | Freq: Once | ORAL | 0 refills | Status: AC
  Filled 2023-10-19: qty 354, 2d supply, fill #0

## 2023-10-19 MED ORDER — ATORVASTATIN CALCIUM 20 MG PO TABS
20.0000 mg | ORAL_TABLET | Freq: Every day | ORAL | 0 refills | Status: DC
  Filled 2023-10-19 – 2023-12-29 (×2): qty 90, 90d supply, fill #0

## 2023-10-19 MED ORDER — VALSARTAN 80 MG PO TABS
80.0000 mg | ORAL_TABLET | Freq: Every day | ORAL | 1 refills | Status: DC
  Filled 2023-10-19: qty 30, 30d supply, fill #0

## 2023-10-19 NOTE — Progress Notes (Signed)
No egg or soy allergy known to patient  No issues known to pt with past sedation with any surgeries or procedures Patient denies ever being told they had issues or difficulty with intubation  No FH of Malignant Hyperthermia Pt is not on diet pills Pt is not on  home 02  Pt is not on blood thinners  Pt denies issues with constipation  Pt is not on dialysis Pt denies any upcoming cardiac testing Pt encouraged to use to use Singlecare or Goodrx to reduce cost  Patient's chart reviewed by Osvaldo Angst CNRA prior to previsit and patient appropriate for the [facility].  Previsit completed and red dot placed by patient's name on their procedure day (on provider's schedule).  Marland Kitchen

## 2023-10-19 NOTE — Progress Notes (Signed)
Patient states no other concerns to discuss.

## 2023-10-19 NOTE — Progress Notes (Signed)
Patient ID: Karla Krause, female    DOB: 11/16/1976  MRN: [id number]  CC: Chronic Conditions Follow-Up  Subjective: Karla Krause is a 47 y.o. female who presents for chronic conditions follow-up.   Her concerns today include:  - Doing well on Valsartan, no issues/concerns. Blood pressures outside of office above goal. She does not complain of red flag symptoms such as but not limited to chest pain, shortness of breath, worst headache of life, nausea/vomiting.  - Doing well on Atorvastatin, no issues/concerns.    Patient Active Problem List   Diagnosis Date Noted   Hyperlipidemia 09/13/2023   Prediabetes 05/02/2023   Anemia 05/02/2023   Cervical radiculitis 01/22/2020     Current Outpatient Medications on File Prior to Visit  Medication Sig Dispense Refill   fluticasone (FLONASE) 50 MCG/ACT nasal spray Place 2 sprays into both nostrils daily. 16 g 1   Iron, Ferrous Sulfate, 325 (65 Fe) MG TABS Take 1 tablet by mouth daily. 90 tablet 0   Multiple Vitamin (MULTIVITAMIN) tablet Take 1 tablet by mouth daily.     No current facility-administered medications on file prior to visit.    No Known Allergies  Social History   Socioeconomic History   Marital status: Married    Spouse name: Not on file   Number of children: Not on file   Years of education: Not on file   Highest education level: Not on file  Occupational History   Not on file  Tobacco Use   Smoking status: Never   Smokeless tobacco: Never  Vaping Use   Vaping status: Never Used  Substance and Sexual Activity   Alcohol use: Not Currently   Drug use: Never   Sexual activity: Yes    Birth control/protection: None  Other Topics Concern   Not on file  Social History Narrative   Not on file   Social Determinants of Health   Financial Resource Strain: Not on file  Food Insecurity: Not on file  Transportation Needs: Not on file  Physical Activity: Not on file  Stress: Not on file  Social  Connections: Not on file  Intimate Partner Violence: Not on file    Family History  Problem Relation Age of Onset   Colon cancer Neg Hx    Esophageal cancer Neg Hx    Rectal cancer Neg Hx    Stomach cancer Neg Hx     Past Surgical History:  Procedure Laterality Date   CHOLECYSTECTOMY      ROS: Review of Systems Negative except as stated above  PHYSICAL EXAM: BP (!) 145/93   Pulse 76   Temp 98 F (36.7 C) (Oral)   Ht 5\' 6"  (1.676 m)   Wt 203 lb 6.4 oz (92.3 kg)   LMP 10/13/2023 (Exact Date)   SpO2 98%   BMI 32.83 kg/m   Physical Exam HENT:     Head: Normocephalic and atraumatic.     Nose: Nose normal.     Mouth/Throat:     Mouth: Mucous membranes are moist.     Pharynx: Oropharynx is clear.  Eyes:     Extraocular Movements: Extraocular movements intact.     Conjunctiva/sclera: Conjunctivae normal.     Pupils: Pupils are equal, round, and reactive to light.  Cardiovascular:     Rate and Rhythm: Normal rate and regular rhythm.     Pulses: Normal pulses.     Heart sounds: Normal heart sounds.  Pulmonary:     Effort: Pulmonary effort  is normal.     Breath sounds: Normal breath sounds.  Musculoskeletal:        General: Normal range of motion.     Cervical back: Normal range of motion and neck supple.  Neurological:     General: No focal deficit present.     Mental Status: She is alert and oriented to person, place, and time.  Psychiatric:        Mood and Affect: Mood normal.        Behavior: Behavior normal.     ASSESSMENT AND PLAN: 1. Primary hypertension - Blood pressure not at goal during today's visit. Patient asymptomatic without chest pressure, chest pain, palpitations, shortness of breath, worst headache of life, and any additional red flag symptoms. - Increase Valsartan from 40 mg daily to 80 mg daily.  - Routine screening.  - Counseled on blood pressure goal of less than 130/80, low-sodium, DASH diet, medication compliance, and 150 minutes of  moderate intensity exercise per week as tolerated. Counseled on medication adherence and adverse effects. - Patient declined referral to Cardiology.  - Follow-up with primary provider in 2 weeks or sooner if needed for blood pressure check. - valsartan (DIOVAN) 80 MG tablet; Take 1 tablet (80 mg total) by mouth daily.  Dispense: 30 tablet; Refill: 1 - Basic Metabolic Panel  2. Hyperlipidemia, unspecified hyperlipidemia type - Continue Atorvastatin as prescribed. Counseled on medication adherence/adverse effects.  - Routine screening.  - Follow-up with primary provider as scheduled.  - atorvastatin (LIPITOR) 20 MG tablet; Take 1 tablet (20 mg total) by mouth daily.  Dispense: 90 tablet; Refill: 0 - Lipid panel  3. Prediabetes - Routine screening.  - Hemoglobin A1c    Patient was given the opportunity to ask questions.  Patient verbalized understanding of the plan and was able to repeat key elements of the plan. Patient was given clear instructions to go to Emergency Department or return to medical center if symptoms don't improve, worsen, or new problems develop.The patient verbalized understanding.   Orders Placed This Encounter  Procedures   Basic Metabolic Panel   Lipid panel   Hemoglobin A1c     Requested Prescriptions   Signed Prescriptions Disp Refills   atorvastatin (LIPITOR) 20 MG tablet 90 tablet 0    Sig: Take 1 tablet (20 mg total) by mouth daily.   valsartan (DIOVAN) 80 MG tablet 30 tablet 1    Sig: Take 1 tablet (80 mg total) by mouth daily.    Return in about 2 weeks (around 11/02/2023) for Follow-Up or next available blood pressure check.  Rema Fendt, NP

## 2023-10-20 ENCOUNTER — Other Ambulatory Visit (HOSPITAL_COMMUNITY): Payer: Self-pay

## 2023-10-20 LAB — LIPID PANEL
Chol/HDL Ratio: 1.8 ratio (ref 0.0–4.4)
Cholesterol, Total: 148 mg/dL (ref 100–199)
HDL: 81 mg/dL (ref >39)
LDL Chol Calc (NIH): 51 mg/dL (ref 0–99)
Triglycerides: 84 mg/dL (ref 0–149)
VLDL Cholesterol Cal: 16 mg/dL (ref 5–40)

## 2023-10-20 LAB — BASIC METABOLIC PANEL
BUN/Creatinine Ratio: 14 (ref 9–23)
BUN: 11 mg/dL (ref 6–24)
CO2: 23 mmol/L (ref 20–29)
Calcium: 9.2 mg/dL (ref 8.7–10.2)
Chloride: 103 mmol/L (ref 96–106)
Creatinine, Ser: 0.76 mg/dL (ref 0.57–1.00)
Glucose: 83 mg/dL (ref 70–99)
Potassium: 4.3 mmol/L (ref 3.5–5.2)
Sodium: 139 mmol/L (ref 134–144)
eGFR: 97 mL/min/{1.73_m2} (ref >59)

## 2023-10-20 LAB — HEMOGLOBIN A1C
Est. average glucose Bld gHb Est-mCnc: 117 mg/dL
Hgb A1c MFr Bld: 5.7 % — ABNORMAL HIGH (ref 4.8–5.6)

## 2023-10-25 ENCOUNTER — Ambulatory Visit: Payer: 59 | Admitting: Internal Medicine

## 2023-10-25 ENCOUNTER — Encounter: Payer: Self-pay | Admitting: Internal Medicine

## 2023-10-25 VITALS — BP 145/90 | HR 81 | Temp 97.5°F | Resp 11 | Ht 66.0 in | Wt 190.0 lb

## 2023-10-25 DIAGNOSIS — Z1211 Encounter for screening for malignant neoplasm of colon: Secondary | ICD-10-CM

## 2023-10-25 DIAGNOSIS — D124 Benign neoplasm of descending colon: Secondary | ICD-10-CM

## 2023-10-25 DIAGNOSIS — K635 Polyp of colon: Secondary | ICD-10-CM | POA: Diagnosis not present

## 2023-10-25 DIAGNOSIS — E785 Hyperlipidemia, unspecified: Secondary | ICD-10-CM | POA: Diagnosis not present

## 2023-10-25 DIAGNOSIS — I10 Essential (primary) hypertension: Secondary | ICD-10-CM | POA: Diagnosis not present

## 2023-10-25 MED ORDER — SODIUM CHLORIDE 0.9 % IV SOLN: 500.0000 mL | Freq: Once | INTRAVENOUS | Status: DC

## 2023-10-25 NOTE — Progress Notes (Signed)
Called to room to assist during endoscopic procedure.  Patient ID and intended procedure confirmed with present staff. Received instructions for my participation in the procedure from the performing physician.  

## 2023-10-25 NOTE — Progress Notes (Signed)
Report to PACU, RN, vss, BBS= Clear.  

## 2023-10-25 NOTE — Patient Instructions (Signed)
 Educational handout provided to patient related to  Polyps  Resume previous diet  Continue present medications  Awaiting pathology results   YOU HAD AN ENDOSCOPIC PROCEDURE TODAY AT THE  ENDOSCOPY CENTER:   Refer to the procedure report that was given to you for any specific questions about what was found during the examination.  If the procedure report does not answer your questions, please call your gastroenterologist to clarify.  If you requested that your care partner not be given the details of your procedure findings, then the procedure report has been included in a sealed envelope for you to review at your convenience later.  YOU SHOULD EXPECT: Some feelings of bloating in the abdomen. Passage of more gas than usual.  Walking can help get rid of the air that was put into your GI tract during the procedure and reduce the bloating. If you had a lower endoscopy (such as a colonoscopy or flexible sigmoidoscopy) you may notice spotting of blood in your stool or on the toilet paper. If you underwent a bowel prep for your procedure, you may not have a normal bowel movement for a few days.  Please Note:  You might notice some irritation and congestion in your nose or some drainage.  This is from the oxygen used during your procedure.  There is no need for concern and it should clear up in a day or so.  SYMPTOMS TO REPORT IMMEDIATELY:  Following lower endoscopy (colonoscopy or flexible sigmoidoscopy):  Excessive amounts of blood in the stool  Significant tenderness or worsening of abdominal pains  Swelling of the abdomen that is new, acute  Fever of 100F or higher   For urgent or emergent issues, a gastroenterologist can be reached at any hour by calling [phone]. Do not use MyChart messaging for urgent concerns.    DIET:  We do recommend a small meal at first, but then you may proceed to your regular diet.  Drink plenty of fluids but you should avoid alcoholic beverages for  24 hours.  ACTIVITY:  You should plan to take it easy for the rest of today and you should NOT DRIVE or use heavy machinery until tomorrow (because of the sedation medicines used during the test).    FOLLOW UP: Our staff will call the number listed on your records the next business day following your procedure.  We will call around 7:15- 8:00 am to check on you and address any questions or concerns that you may have regarding the information given to you following your procedure. If we do not reach you, we will leave a message.     If any biopsies were taken you will be contacted by phone or by letter within the next 1-3 weeks.  Please call us at [phone] if you have not heard about the biopsies in 3 weeks.    SIGNATURES/CONFIDENTIALITY: You and/or your care partner have signed paperwork which will be entered into your electronic medical record.  These signatures attest to the fact that that the information above on your After Visit Summary has been reviewed and is understood.  Full responsibility of the confidentiality of this discharge information lies with you and/or your care-partner.

## 2023-10-25 NOTE — Progress Notes (Signed)
Pt's states no medical or surgical changes since previsit or office visit. 

## 2023-10-25 NOTE — Op Note (Signed)
[facility] Patient Name: Karla Krause Procedure Date: 10/25/2023 9:47 AM MRN: [id number] Endoscopist: Beverley Fiedler , MD, [id number] Age: 47 Referring MD:  Date of Birth: 11/27/1976 Gender: Female Account #: [id number] Procedure:                Colonoscopy Indications:              Screening for colorectal malignant neoplasm Medicines:                Monitored Anesthesia Care Procedure:                Pre-Anesthesia Assessment:                           - Prior to the procedure, a History and Physical                            was performed, and patient medications and                            allergies were reviewed. The patient's tolerance of                            previous anesthesia was also reviewed. The risks                            and benefits of the procedure and the sedation                            options and risks were discussed with the patient.                            All questions were answered, and informed consent                            was obtained. Prior Anticoagulants: The patient has                            taken no anticoagulant or antiplatelet agents. ASA                            Grade Assessment: II - A patient with mild systemic                            disease. After reviewing the risks and benefits,                            the patient was deemed in satisfactory condition to                            undergo the procedure.                           After obtaining informed consent, the colonoscope  was passed under direct vision. Throughout the                            procedure, the patient's blood pressure, pulse, and                            oxygen saturations were monitored continuously. The                            Olympus Scope [id number] was introduced through the                            anus and advanced to the cecum, identified by                            appendiceal  orifice and ileocecal valve. The                            colonoscopy was performed without difficulty. The                            patient tolerated the procedure well. The quality                            of the bowel preparation was excellent. The                            ileocecal valve, appendiceal orifice, and rectum                            were photographed. Scope In: 9:49:28 AM Scope Out: 10:01:49 AM Scope Withdrawal Time: 0 hours 9 minutes 35 seconds  Total Procedure Duration: 0 hours 12 minutes 21 seconds  Findings:                 The digital rectal exam was normal.                           A 4 mm polyp was found in the descending colon. The                            polyp was sessile. The polyp was removed with a                            cold snare. Resection and retrieval were complete.                           The exam was otherwise without abnormality on                            direct and retroflexion views. Complications:            No immediate complications. Estimated Blood Loss:     Estimated blood loss: none. Impression:               -  One 4 mm polyp in the descending colon, removed                            with a cold snare. Resected and retrieved.                           - The examination was otherwise normal on direct                            and retroflexion views. Recommendation:           - Patient has a contact number available for                            emergencies. The signs and symptoms of potential                            delayed complications were discussed with the                            patient. Return to normal activities tomorrow.                            Written discharge instructions were provided to the                            patient.                           - Resume previous diet.                           - Continue present medications.                           - Await pathology results.                            - Repeat colonoscopy is recommended. The                            colonoscopy date will be determined after pathology                            results from today's exam become available for                            review. Beverley Fiedler, MD 10/25/2023 10:03:49 AM This report has been signed electronically.

## 2023-10-25 NOTE — Progress Notes (Signed)
GASTROENTEROLOGY PROCEDURE H&P NOTE   Primary Care Physician: Rema Fendt, NP    Reason for Procedure:  Colon cancer screening  Plan:    Colonoscopy  Patient is appropriate for endoscopic procedure(s) in the ambulatory ([facility]) setting.  The nature of the procedure, as well as the risks, benefits, and alternatives were carefully and thoroughly reviewed with the patient. Ample time for discussion and questions allowed. The patient understood, was satisfied, and agreed to proceed.     HPI: Karla Krause is a 47 y.o. female who presents for colonoscopy.  Medical history as below.  Tolerated the prep.  No recent chest pain or shortness of breath.  No abdominal pain today.  Past Medical History:  Diagnosis Date   Allergy    Hyperlipidemia    Hypertension     Past Surgical History:  Procedure Laterality Date   CHOLECYSTECTOMY      Prior to Admission medications   Medication Sig Start Date End Date Taking? Authorizing Provider  atorvastatin (LIPITOR) 20 MG tablet Take 1 tablet (20 mg total) by mouth daily. 10/19/23  Yes Zonia Kief, Amy J, NP  Multiple Vitamin (MULTIVITAMIN) tablet Take 1 tablet by mouth daily.   Yes [provider]  valsartan (DIOVAN) 80 MG tablet Take 1 tablet (80 mg total) by mouth daily. 10/19/23  Yes Zonia Kief, Amy J, NP  fluticasone (FLONASE) 50 MCG/ACT nasal spray Place 2 sprays into both nostrils daily. 04/29/23   Rema Fendt, NP  Iron, Ferrous Sulfate, 325 (65 Fe) MG TABS Take 1 tablet by mouth daily. 09/12/23   Rema Fendt, NP    Current Outpatient Medications  Medication Sig Dispense Refill   atorvastatin (LIPITOR) 20 MG tablet Take 1 tablet (20 mg total) by mouth daily. 90 tablet 0   Multiple Vitamin (MULTIVITAMIN) tablet Take 1 tablet by mouth daily.     valsartan (DIOVAN) 80 MG tablet Take 1 tablet (80 mg total) by mouth daily. 30 tablet 1   fluticasone (FLONASE) 50 MCG/ACT nasal spray Place 2 sprays into both nostrils daily.  16 g 1   Iron, Ferrous Sulfate, 325 (65 Fe) MG TABS Take 1 tablet by mouth daily. 90 tablet 0   Current Facility-Administered Medications  Medication Dose Route Frequency Provider Last Rate Last Admin   0.9 %  sodium chloride infusion  500 mL Intravenous Once Jaziah Goeller, Carie Caddy, MD        Allergies as of 10/25/2023   (No Known Allergies)    Family History  Problem Relation Age of Onset   Colon cancer Neg Hx    Esophageal cancer Neg Hx    Rectal cancer Neg Hx    Stomach cancer Neg Hx     Social History   Socioeconomic History   Marital status: Married    Spouse name: Not on file   Number of children: Not on file   Years of education: Not on file   Highest education level: Not on file  Occupational History   Not on file  Tobacco Use   Smoking status: Never   Smokeless tobacco: Never  Vaping Use   Vaping status: Never Used  Substance and Sexual Activity   Alcohol use: Not Currently   Drug use: Never   Sexual activity: Yes    Birth control/protection: None  Other Topics Concern   Not on file  Social History Narrative   Not on file   Social Determinants of Health   Financial Resource Strain: Not on file  Food Insecurity: No Food Insecurity (10/19/2023)   Hunger Vital Sign    Worried About Running Out of Food in the Last Year: Never true    Ran Out of Food in the Last Year: Never true  Transportation Needs: No Transportation Needs (10/19/2023)   PRAPARE - Administrator, Civil Service (Medical): No    Lack of Transportation (Non-Medical): No  Physical Activity: Not on file  Stress: Not on file  Social Connections: Not on file  Intimate Partner Violence: Not At Risk (10/19/2023)   Humiliation, Afraid, Rape, and Kick questionnaire    Fear of Current or Ex-Partner: No    Emotionally Abused: No    Physically Abused: No    Sexually Abused: No    Physical Exam: Vital signs in last 24 hours: @BP  (!) 150/106   Pulse 88   Temp (!) 97.5 F (36.4 C)  (Temporal)   Ht 5\' 6"  (1.676 m)   Wt 190 lb (86.2 kg)   LMP 10/13/2023 (Exact Date)   SpO2 100%   BMI 30.67 kg/m  GEN: NAD EYE: Sclerae anicteric ENT: MMM CV: Non-tachycardic Pulm: CTA b/l GI: Soft, NT/ND NEURO:  Alert & Oriented x 3   Erick Blinks, MD [facility]  10/25/2023 9:40 AM

## 2023-10-26 ENCOUNTER — Telehealth: Payer: Self-pay

## 2023-10-26 NOTE — Telephone Encounter (Signed)
  Follow up Call-     10/25/2023    8:55 AM  Call back number  Post procedure Call Back phone  # [phone]  Permission to leave phone message Yes     Patient questions:  Do you have a fever, pain , or abdominal swelling? No. Pain Score  0 *  Have you tolerated food without any problems? Yes.    Have you been able to return to your normal activities? Yes.    Do you have any questions about your discharge instructions: Diet   No. Medications  No. Follow up visit  No.  Do you have questions or concerns about your Care? No.  Actions: * If pain score is 4 or above: No action needed, pain <4.

## 2023-10-27 LAB — SURGICAL PATHOLOGY

## 2023-11-01 ENCOUNTER — Encounter: Payer: Self-pay | Admitting: Internal Medicine

## 2023-11-04 ENCOUNTER — Other Ambulatory Visit (HOSPITAL_COMMUNITY): Payer: Self-pay

## 2023-11-04 ENCOUNTER — Ambulatory Visit (INDEPENDENT_AMBULATORY_CARE_PROVIDER_SITE_OTHER): Payer: 59 | Admitting: Family

## 2023-11-04 VITALS — BP 127/88 | HR 101 | Temp 98.2°F | Ht 66.0 in | Wt 190.0 lb

## 2023-11-04 DIAGNOSIS — I10 Essential (primary) hypertension: Secondary | ICD-10-CM

## 2023-11-04 MED ORDER — VALSARTAN 80 MG PO TABS
80.0000 mg | ORAL_TABLET | Freq: Every day | ORAL | 0 refills | Status: DC
  Filled 2023-11-04 – 2023-11-16 (×2): qty 90, 90d supply, fill #0

## 2023-11-04 NOTE — Progress Notes (Signed)
Patient ID: Karla Krause, female    DOB: 11/22/1976  MRN: [id number]  CC: Blood Pressure Check  Subjective: Karla Krause is a 47 y.o. female who presents for blood pressure check.  Her concerns today include:  Doing well on Valsartan, no issues/concerns. Reports she is watching what she eats. She does not complain of red flag symptoms such as but not limited to chest pain, shortness of breath, worst headache of life, nausea/vomiting.   Patient Active Problem List   Diagnosis Date Noted   Hyperlipidemia 09/13/2023   Prediabetes 05/02/2023   Anemia 05/02/2023   Cervical radiculitis 01/22/2020     Current Outpatient Medications on File Prior to Visit  Medication Sig Dispense Refill   atorvastatin (LIPITOR) 20 MG tablet Take 1 tablet (20 mg total) by mouth daily. 90 tablet 0   fluticasone (FLONASE) 50 MCG/ACT nasal spray Place 2 sprays into both nostrils daily. 16 g 1   Iron, Ferrous Sulfate, 325 (65 Fe) MG TABS Take 1 tablet by mouth daily. 90 tablet 0   Multiple Vitamin (MULTIVITAMIN) tablet Take 1 tablet by mouth daily.     No current facility-administered medications on file prior to visit.    No Known Allergies  Social History   Socioeconomic History   Marital status: Married    Spouse name: Not on file   Number of children: Not on file   Years of education: Not on file   Highest education level: Not on file  Occupational History   Not on file  Tobacco Use   Smoking status: Never   Smokeless tobacco: Never  Vaping Use   Vaping status: Never Used  Substance and Sexual Activity   Alcohol use: Not Currently   Drug use: Never   Sexual activity: Yes    Birth control/protection: None  Other Topics Concern   Not on file  Social History Narrative   Not on file   Social Determinants of Health   Financial Resource Strain: Not on file  Food Insecurity: No Food Insecurity (10/19/2023)   Hunger Vital Sign    Worried About Running Out of Food in the  Last Year: Never true    Ran Out of Food in the Last Year: Never true  Transportation Needs: No Transportation Needs (10/19/2023)   PRAPARE - Administrator, Civil Service (Medical): No    Lack of Transportation (Non-Medical): No  Physical Activity: Not on file  Stress: Not on file  Social Connections: Not on file  Intimate Partner Violence: Not At Risk (10/19/2023)   Humiliation, Afraid, Rape, and Kick questionnaire    Fear of Current or Ex-Partner: No    Emotionally Abused: No    Physically Abused: No    Sexually Abused: No    Family History  Problem Relation Age of Onset   Colon cancer Neg Hx    Esophageal cancer Neg Hx    Rectal cancer Neg Hx    Stomach cancer Neg Hx     Past Surgical History:  Procedure Laterality Date   CHOLECYSTECTOMY      ROS: Review of Systems Negative except as stated above  PHYSICAL EXAM: BP 127/88   LMP 10/13/2023 (Exact Date)   Physical Exam HENT:     Head: Normocephalic and atraumatic.     Nose: Nose normal.     Mouth/Throat:     Mouth: Mucous membranes are moist.     Pharynx: Oropharynx is clear.  Eyes:     Extraocular Movements:  Extraocular movements intact.     Conjunctiva/sclera: Conjunctivae normal.     Pupils: Pupils are equal, round, and reactive to light.  Cardiovascular:     Rate and Rhythm: Normal rate and regular rhythm.     Pulses: Normal pulses.     Heart sounds: Normal heart sounds.  Pulmonary:     Effort: Pulmonary effort is normal.     Breath sounds: Normal breath sounds.  Musculoskeletal:        General: Normal range of motion.     Cervical back: Normal range of motion and neck supple.  Neurological:     General: No focal deficit present.     Mental Status: She is alert and oriented to person, place, and time.  Psychiatric:        Mood and Affect: Mood normal.        Behavior: Behavior normal.     ASSESSMENT AND PLAN: 1. Primary hypertension - Continue Valsartan as prescribed.  - Routine  screening.  - Counseled on blood pressure goal of less than 130/80, low-sodium, DASH diet, medication compliance, and 150 minutes of moderate intensity exercise per week as tolerated. Counseled on medication adherence and adverse effects. - Follow-up with primary provider in 3 months or sooner if needed.  - valsartan (DIOVAN) 80 MG tablet; Take 1 tablet (80 mg total) by mouth daily.  Dispense: 90 tablet; Refill: 0 - Basic Metabolic Panel   Patient was given the opportunity to ask questions.  Patient verbalized understanding of the plan and was able to repeat key elements of the plan. Patient was given clear instructions to go to Emergency Department or return to medical center if symptoms don't improve, worsen, or new problems develop.The patient verbalized understanding.   Orders Placed This Encounter  Procedures   Basic Metabolic Panel     Requested Prescriptions   Signed Prescriptions Disp Refills   valsartan (DIOVAN) 80 MG tablet 90 tablet 0    Sig: Take 1 tablet (80 mg total) by mouth daily.    Return in about 3 months (around 02/04/2024) for Follow-Up or next available chronic conditions.  Rema Fendt, NP

## 2023-11-05 LAB — BASIC METABOLIC PANEL
BUN/Creatinine Ratio: 13 (ref 9–23)
BUN: 8 mg/dL (ref 6–24)
CO2: 22 mmol/L (ref 20–29)
Calcium: 9.1 mg/dL (ref 8.7–10.2)
Chloride: 103 mmol/L (ref 96–106)
Creatinine, Ser: 0.64 mg/dL (ref 0.57–1.00)
Glucose: 75 mg/dL (ref 70–99)
Potassium: 4.7 mmol/L (ref 3.5–5.2)
Sodium: 141 mmol/L (ref 134–144)
eGFR: 110 mL/min/{1.73_m2} (ref >59)

## 2023-11-05 LAB — SPECIMEN STATUS REPORT

## 2023-11-17 ENCOUNTER — Other Ambulatory Visit (HOSPITAL_COMMUNITY): Payer: Self-pay

## 2023-11-18 ENCOUNTER — Other Ambulatory Visit (HOSPITAL_COMMUNITY): Payer: Self-pay

## 2023-12-29 ENCOUNTER — Other Ambulatory Visit (HOSPITAL_COMMUNITY): Payer: Self-pay

## 2024-02-06 ENCOUNTER — Ambulatory Visit (INDEPENDENT_AMBULATORY_CARE_PROVIDER_SITE_OTHER): Payer: Commercial Managed Care - PPO | Admitting: Family

## 2024-02-06 ENCOUNTER — Other Ambulatory Visit (HOSPITAL_COMMUNITY): Payer: Self-pay

## 2024-02-06 VITALS — BP 143/83 | HR 91 | Temp 98.9°F | Ht 67.0 in | Wt 201.6 lb

## 2024-02-06 DIAGNOSIS — I10 Essential (primary) hypertension: Secondary | ICD-10-CM | POA: Diagnosis not present

## 2024-02-06 MED ORDER — VALSARTAN 80 MG PO TABS
80.0000 mg | ORAL_TABLET | Freq: Every day | ORAL | 0 refills | Status: DC
  Filled 2024-02-06: qty 90, 90d supply, fill #0

## 2024-02-06 NOTE — Progress Notes (Signed)
 Patient ID: RHEANA CASEBOLT, female    DOB: 08-Jan-1976  MRN: [id number]  CC: Chronic Conditions Follow-Up  Subjective: Karla Krause is a 48 y.o. female who presents for chronic conditions follow-up.   Her concerns today include:  - Doing well on Valsartan, no issues/concerns. Home blood pressures at goal. States she was nervous/uncomfortable during rooming process. She does not complain of red flag symptoms such as but not limited to chest pain, shortness of breath, worst headache of life, nausea/vomiting.    Patient Active Problem List   Diagnosis Date Noted   Hyperlipidemia 09/13/2023   Prediabetes 05/02/2023   Anemia 05/02/2023   Cervical radiculitis 01/22/2020     Current Outpatient Medications on File Prior to Visit  Medication Sig Dispense Refill   atorvastatin (LIPITOR) 20 MG tablet Take 1 tablet (20 mg total) by mouth daily. 90 tablet 0   fluticasone (FLONASE) 50 MCG/ACT nasal spray Place 2 sprays into both nostrils daily. 16 g 1   Iron, Ferrous Sulfate, 325 (65 Fe) MG TABS Take 1 tablet by mouth daily. 90 tablet 0   Multiple Vitamin (MULTIVITAMIN) tablet Take 1 tablet by mouth daily.     No current facility-administered medications on file prior to visit.    No Known Allergies  Social History   Socioeconomic History   Marital status: Married    Spouse name: Not on file   Number of children: Not on file   Years of education: Not on file   Highest education level: Not on file  Occupational History   Not on file  Tobacco Use   Smoking status: Never   Smokeless tobacco: Never  Vaping Use   Vaping status: Never Used  Substance and Sexual Activity   Alcohol use: Not Currently   Drug use: Never   Sexual activity: Yes    Birth control/protection: None  Other Topics Concern   Not on file  Social History Narrative   Not on file   Social Drivers of Health   Financial Resource Strain: Not on file  Food Insecurity: No Food Insecurity (10/19/2023)    Hunger Vital Sign    Worried About Running Out of Food in the Last Year: Never true    Ran Out of Food in the Last Year: Never true  Transportation Needs: No Transportation Needs (10/19/2023)   PRAPARE - Administrator, Civil Service (Medical): No    Lack of Transportation (Non-Medical): No  Physical Activity: Not on file  Stress: Not on file  Social Connections: Not on file  Intimate Partner Violence: Not At Risk (10/19/2023)   Humiliation, Afraid, Rape, and Kick questionnaire    Fear of Current or Ex-Partner: No    Emotionally Abused: No    Physically Abused: No    Sexually Abused: No    Family History  Problem Relation Age of Onset   Colon cancer Neg Hx    Esophageal cancer Neg Hx    Rectal cancer Neg Hx    Stomach cancer Neg Hx     Past Surgical History:  Procedure Laterality Date   CHOLECYSTECTOMY      ROS: Review of Systems Negative except as stated above  PHYSICAL EXAM: BP (!) 143/83   Pulse 91   Temp 98.9 F (37.2 C) (Oral)   Ht 5\' 7"  (1.702 m)   Wt 201 lb 9.6 oz (91.4 kg)   LMP 01/23/2024 (Approximate)   SpO2 96%   BMI 31.58 kg/m   Physical Exam HENT:  Head: Normocephalic and atraumatic.     Nose: Nose normal.     Mouth/Throat:     Mouth: Mucous membranes are moist.     Pharynx: Oropharynx is clear.  Eyes:     Extraocular Movements: Extraocular movements intact.     Conjunctiva/sclera: Conjunctivae normal.     Pupils: Pupils are equal, round, and reactive to light.  Cardiovascular:     Rate and Rhythm: Normal rate and regular rhythm.     Pulses: Normal pulses.     Heart sounds: Normal heart sounds.  Pulmonary:     Effort: Pulmonary effort is normal.     Breath sounds: Normal breath sounds.  Musculoskeletal:        General: Normal range of motion.     Cervical back: Normal range of motion and neck supple.  Neurological:     General: No focal deficit present.     Mental Status: She is alert and oriented to person, place, and  time.  Psychiatric:        Mood and Affect: Mood normal.        Behavior: Behavior normal.     ASSESSMENT AND PLAN: 1. Primary hypertension (Primary) - Blood pressure not at goal during today's visit. Patient asymptomatic without chest pressure, chest pain, palpitations, shortness of breath, worst headache of life, and any additional red flag symptoms. - Patient declined pharmacological therapy adjustment.  - Continue Valsartan as prescribed.  - Counseled on blood pressure goal of less than 130/80, low-sodium, DASH diet, medication compliance, and 150 minutes of moderate intensity exercise per week as tolerated. Counseled on medication adherence and adverse effects. - Follow-up with primary provider in 4 weeks or sooner if needed.  - valsartan (DIOVAN) 80 MG tablet; Take 1 tablet (80 mg total) by mouth daily.  Dispense: 90 tablet; Refill: 0   Patient was given the opportunity to ask questions.  Patient verbalized understanding of the plan and was able to repeat key elements of the plan. Patient was given clear instructions to go to Emergency Department or return to medical center if symptoms don't improve, worsen, or new problems develop.The patient verbalized understanding.    Requested Prescriptions   Signed Prescriptions Disp Refills   valsartan (DIOVAN) 80 MG tablet 90 tablet 0    Sig: Take 1 tablet (80 mg total) by mouth daily.    Return in about 4 weeks (around 03/05/2024) for Follow-Up or next available chronic conditions.  Rema Fendt, NP

## 2024-02-06 NOTE — Progress Notes (Signed)
 Patientr states no other concerns to discuss.

## 2024-02-20 ENCOUNTER — Other Ambulatory Visit (HOSPITAL_COMMUNITY): Payer: Self-pay

## 2024-03-06 ENCOUNTER — Ambulatory Visit: Payer: Commercial Managed Care - PPO | Admitting: Family

## 2024-03-27 ENCOUNTER — Other Ambulatory Visit (HOSPITAL_COMMUNITY): Payer: Self-pay

## 2024-03-27 ENCOUNTER — Ambulatory Visit: Admitting: Family

## 2024-03-27 ENCOUNTER — Encounter: Payer: Self-pay | Admitting: Family

## 2024-03-27 VITALS — BP 142/95 | HR 86 | Temp 98.1°F | Resp 16 | Ht 66.0 in | Wt 196.0 lb

## 2024-03-27 DIAGNOSIS — J3089 Other allergic rhinitis: Secondary | ICD-10-CM

## 2024-03-27 DIAGNOSIS — Z23 Encounter for immunization: Secondary | ICD-10-CM

## 2024-03-27 DIAGNOSIS — D509 Iron deficiency anemia, unspecified: Secondary | ICD-10-CM

## 2024-03-27 DIAGNOSIS — Z1321 Encounter for screening for nutritional disorder: Secondary | ICD-10-CM | POA: Diagnosis not present

## 2024-03-27 DIAGNOSIS — I10 Essential (primary) hypertension: Secondary | ICD-10-CM | POA: Diagnosis not present

## 2024-03-27 DIAGNOSIS — Z1329 Encounter for screening for other suspected endocrine disorder: Secondary | ICD-10-CM

## 2024-03-27 MED ORDER — FLUTICASONE PROPIONATE 50 MCG/ACT NA SUSP
2.0000 | Freq: Every day | NASAL | 2 refills | Status: AC
  Filled 2024-03-27: qty 16, 30d supply, fill #0
  Filled 2024-08-14: qty 16, 30d supply, fill #1

## 2024-03-27 MED ORDER — AMLODIPINE BESYLATE 5 MG PO TABS
5.0000 mg | ORAL_TABLET | Freq: Every day | ORAL | 0 refills | Status: DC
  Filled 2024-03-27: qty 90, 90d supply, fill #0

## 2024-03-27 MED ORDER — IRON (FERROUS SULFATE) 325 (65 FE) MG PO TABS
1.0000 | ORAL_TABLET | Freq: Every day | ORAL | 0 refills | Status: AC
  Filled 2024-03-27: qty 90, 90d supply, fill #0

## 2024-03-27 NOTE — Progress Notes (Signed)
 Patient ID: Karla Krause, female    DOB: [birth year]  MRN: [id number]  CC: Chronic Conditions Follow-Up  Subjective: Karla Krause is a 48 y.o. female who presents for chronic conditions follow-up.   Her concerns today include:  - States she noticed hair thinning/shedding 2 months ago. States her hairstylist also noticed changes in her hair texture several months ago. Patient concerned if hair thinning/shedding and texture changes are related to Valsartan. Otherwise patient reports she is doing well on Valsartan. States she considered if hair changes are related to her stylist recently dyed her hair. Also, states she has considered if hair changes are related to approaching menopause and reports she has discussed this with Gynecology. She does not complain of red flag symptoms such as but not limited to chest pain, shortness of breath, worst headache of life, nausea/vomiting. She is ready to try a different blood pressure medication. - Doing well on Ferrous Sulfate, no issues/concerns. - Doing well on Fluticasone, no issues/concerns.  Patient Active Problem List   Diagnosis Date Noted   Hyperlipidemia 09/13/2023   Prediabetes 05/02/2023   Anemia 05/02/2023   Cervical radiculitis 01/22/2020     Current Outpatient Medications on File Prior to Visit  Medication Sig Dispense Refill   atorvastatin (LIPITOR) 20 MG tablet Take 1 tablet (20 mg total) by mouth daily. 90 tablet 0   Multiple Vitamin (MULTIVITAMIN) tablet Take 1 tablet by mouth daily.     valsartan (DIOVAN) 80 MG tablet Take 1 tablet (80 mg total) by mouth daily. 90 tablet 0   No current facility-administered medications on file prior to visit.    No Known Allergies  Social History   Socioeconomic History   Marital status: Married    Spouse name: Not on file   Number of children: Not on file   Years of education: Not on file   Highest education level: Not on file  Occupational History   Not on file   Tobacco Use   Smoking status: Never   Smokeless tobacco: Never  Vaping Use   Vaping status: Never Used  Substance and Sexual Activity   Alcohol use: Not Currently   Drug use: Never   Sexual activity: Yes    Birth control/protection: None  Other Topics Concern   Not on file  Social History Narrative   Not on file   Social Drivers of Health   Financial Resource Strain: Low Risk  (03/27/2024)   Overall Financial Resource Strain (CARDIA)    Difficulty of Paying Living Expenses: Not hard at all  Food Insecurity: No Food Insecurity (10/19/2023)   Hunger Vital Sign    Worried About Running Out of Food in the Last Year: Never true    Ran Out of Food in the Last Year: Never true  Transportation Needs: No Transportation Needs (10/19/2023)   PRAPARE - Administrator, Civil Service (Medical): No    Lack of Transportation (Non-Medical): No  Physical Activity: Insufficiently Active (03/27/2024)   Exercise Vital Sign    Days of Exercise per Week: 3 days    Minutes of Exercise per Session: 30 min  Stress: No Stress Concern Present (03/27/2024)   [employer] of Occupational Health - Occupational Stress Questionnaire    Feeling of Stress : Not at all  Social Connections: Socially Integrated (03/27/2024)   Social Connection and Isolation Panel [NHANES]    Frequency of Communication with Friends and Family: More than three times a week    Frequency of  Social Gatherings with Friends and Family: More than three times a week    Attends Religious Services: More than 4 times per year    Active Member of [employer] or Organizations: Yes    Attends Engineer, structural: More than 4 times per year    Marital Status: Married  Catering manager Violence: Not At Risk (10/19/2023)   Humiliation, Afraid, Rape, and Kick questionnaire    Fear of Current or Ex-Partner: No    Emotionally Abused: No    Physically Abused: No    Sexually Abused: No    Family History  Problem Relation Age  of Onset   Colon cancer Neg Hx    Esophageal cancer Neg Hx    Rectal cancer Neg Hx    Stomach cancer Neg Hx     Past Surgical History:  Procedure Laterality Date   CHOLECYSTECTOMY      ROS: Review of Systems Negative except as stated above  PHYSICAL EXAM: BP (!) 142/95   Pulse 86   Temp 98.1 F (36.7 C) (Oral)   Resp 16   Ht 5\' 6"  (1.676 m)   Wt 196 lb (88.9 kg)   LMP 03/01/2023   SpO2 95%   BMI 31.64 kg/m   Physical Exam HENT:     Head: Normocephalic and atraumatic.     Nose: Nose normal.     Mouth/Throat:     Mouth: Mucous membranes are moist.     Pharynx: Oropharynx is clear.  Eyes:     Extraocular Movements: Extraocular movements intact.     Conjunctiva/sclera: Conjunctivae normal.     Pupils: Pupils are equal, round, and reactive to light.  Cardiovascular:     Rate and Rhythm: Normal rate and regular rhythm.     Pulses: Normal pulses.     Heart sounds: Normal heart sounds.  Pulmonary:     Effort: Pulmonary effort is normal.     Breath sounds: Normal breath sounds.  Musculoskeletal:        General: Normal range of motion.     Cervical back: Normal range of motion and neck supple.  Neurological:     General: No focal deficit present.     Mental Status: She is alert and oriented to person, place, and time.  Psychiatric:        Mood and Affect: Mood normal.        Behavior: Behavior normal.     ASSESSMENT AND PLAN: 1. Primary hypertension (Primary) - Blood pressure not at goal during today's visit. Patient asymptomatic without chest pressure, chest pain, palpitations, shortness of breath, worst headache of life, and any additional red flag symptoms. - Valsartan discontinued related to patient's concerns about changes in hair texture.  - Trial Amlodipine as prescribed. - Routine screening.  - Counseled on blood pressure goal of less than 130/80, low-sodium, DASH diet, medication compliance, and 150 minutes of moderate intensity exercise per week as  tolerated. Counseled on medication adherence and adverse effects. - Follow-up with primary provider in 4 weeks or sooner if needed. - amLODipine (NORVASC) 5 MG tablet; Take 1 tablet (5 mg total) by mouth daily.  Dispense: 90 tablet; Refill: 0 - Basic Metabolic Panel  2. Iron deficiency anemia, unspecified iron deficiency anemia type - Continue Ferrous Sulfate as prescribed. Counseled on medication adherence/adverse effects. - Routine screening.  - Follow-up with primary provider as scheduled. - CBC - Iron, Ferrous Sulfate, 325 (65 Fe) MG TABS; Take 1 tablet by mouth daily.  Dispense: 90  tablet; Refill: 0  3. Thyroid disorder screen - Routine screening.  - TSH  4. Encounter for vitamin deficiency screening - Routine screening.  - Vitamin D, 25-hydroxy  5. Perennial allergic rhinitis - Continue Fluticasone as prescribed. Counseled on medication adherence/adverse effects.  - Follow-up with primary provider as scheduled. - fluticasone (FLONASE) 50 MCG/ACT nasal spray; Place 2 sprays into both nostrils daily.  Dispense: 16 g; Refill: 2  6. Immunization due - Administered. - Tdap vaccine greater than or equal to 7yo IM   Patient was given the opportunity to ask questions.  Patient verbalized understanding of the plan and was able to repeat key elements of the plan. Patient was given clear instructions to go to Emergency Department or return to medical center if symptoms don't improve, worsen, or new problems develop.The patient verbalized understanding.   Orders Placed This Encounter  Procedures   Tdap vaccine greater than or equal to 7yo IM   Basic Metabolic Panel   CBC   TSH   Vitamin D, 25-hydroxy     Requested Prescriptions   Signed Prescriptions Disp Refills   amLODipine (NORVASC) 5 MG tablet 90 tablet 0    Sig: Take 1 tablet (5 mg total) by mouth daily.   fluticasone (FLONASE) 50 MCG/ACT nasal spray 16 g 2    Sig: Place 2 sprays into both nostrils daily.   Iron,  Ferrous Sulfate, 325 (65 Fe) MG TABS 90 tablet 0    Sig: Take 1 tablet by mouth daily.    Return in about 4 weeks (around 04/24/2024) for Follow-Up or next available chronic conditions.  Senaida Dama, NP

## 2024-03-27 NOTE — Progress Notes (Signed)
 Patient hair has been fallening out since taking the b/p medication.

## 2024-03-28 ENCOUNTER — Other Ambulatory Visit: Payer: Self-pay | Admitting: Family

## 2024-03-28 ENCOUNTER — Encounter: Payer: Self-pay | Admitting: Family

## 2024-03-28 ENCOUNTER — Other Ambulatory Visit (HOSPITAL_COMMUNITY): Payer: Self-pay

## 2024-03-28 DIAGNOSIS — E559 Vitamin D deficiency, unspecified: Secondary | ICD-10-CM

## 2024-03-28 LAB — BASIC METABOLIC PANEL WITH GFR
BUN/Creatinine Ratio: 14 (ref 9–23)
BUN: 8 mg/dL (ref 6–24)
CO2: 20 mmol/L (ref 20–29)
Calcium: 9.2 mg/dL (ref 8.7–10.2)
Chloride: 104 mmol/L (ref 96–106)
Creatinine, Ser: 0.56 mg/dL — ABNORMAL LOW (ref 0.57–1.00)
Glucose: 76 mg/dL (ref 70–99)
Potassium: 4.2 mmol/L (ref 3.5–5.2)
Sodium: 140 mmol/L (ref 134–144)
eGFR: 113 mL/min/{1.73_m2} (ref >59)

## 2024-03-28 LAB — CBC
Hematocrit: 35.2 % (ref 34.0–46.6)
Hemoglobin: 11.5 g/dL (ref 11.1–15.9)
MCH: 29.3 pg (ref 26.6–33.0)
MCHC: 32.7 g/dL (ref 31.5–35.7)
MCV: 90 fL (ref 79–97)
Platelets: 383 10*3/uL (ref 150–450)
RBC: 3.93 x10E6/uL (ref 3.77–5.28)
RDW: 13 % (ref 11.7–15.4)
WBC: 4.9 10*3/uL (ref 3.4–10.8)

## 2024-03-28 LAB — TSH: TSH: 1.61 u[IU]/mL (ref 0.450–4.500)

## 2024-03-28 LAB — VITAMIN D 25 HYDROXY (VIT D DEFICIENCY, FRACTURES): Vit D, 25-Hydroxy: 25.5 ng/mL — ABNORMAL LOW (ref 30.0–100.0)

## 2024-03-28 MED ORDER — VITAMIN D (ERGOCALCIFEROL) 1.25 MG (50000 UNIT) PO CAPS
50000.0000 [IU] | ORAL_CAPSULE | ORAL | 0 refills | Status: AC
  Filled 2024-03-28: qty 12, 84d supply, fill #0

## 2024-04-23 ENCOUNTER — Encounter: Payer: Self-pay | Admitting: Family

## 2024-04-23 ENCOUNTER — Other Ambulatory Visit (HOSPITAL_COMMUNITY): Payer: Self-pay

## 2024-04-23 ENCOUNTER — Ambulatory Visit (INDEPENDENT_AMBULATORY_CARE_PROVIDER_SITE_OTHER): Admitting: Family

## 2024-04-23 VITALS — BP 123/85 | HR 89 | Temp 98.0°F | Resp 16 | Ht 66.0 in | Wt 195.2 lb

## 2024-04-23 DIAGNOSIS — I10 Essential (primary) hypertension: Secondary | ICD-10-CM

## 2024-04-23 MED ORDER — AMLODIPINE BESYLATE 5 MG PO TABS
5.0000 mg | ORAL_TABLET | Freq: Every day | ORAL | 0 refills | Status: DC
  Filled 2024-04-23 – 2024-07-18 (×2): qty 90, 90d supply, fill #0

## 2024-04-23 NOTE — Progress Notes (Signed)
 Patient ID: Karla Krause, female    DOB: Jul 12, 1976  MRN: [id number]  CC: Chronic Conditions Follow-Up  Subjective: Karla Krause is a 48 y.o. female who presents for chronic conditions follow-up.   Her concerns today include:  Doing well on Amlodipine , no issues/concerns. She limits salt intake. She exercises when able to do so. States she has noticed improvement in hair texture. She does not complain of red flag symptoms such as but not limited to chest pain, shortness of breath, worst headache of life, nausea/vomiting.    Patient Active Problem List   Diagnosis Date Noted   Hyperlipidemia 09/13/2023   Prediabetes 05/02/2023   Anemia 05/02/2023   Cervical radiculitis 01/22/2020     Current Outpatient Medications on File Prior to Visit  Medication Sig Dispense Refill   atorvastatin  (LIPITOR) 20 MG tablet Take 1 tablet (20 mg total) by mouth daily. 90 tablet 0   fluticasone  (FLONASE ) 50 MCG/ACT nasal spray Place 2 sprays into both nostrils daily. 16 g 2   Iron , Ferrous Sulfate , 325 (65 Fe) MG TABS Take 1 tablet by mouth daily. 90 tablet 0   Multiple Vitamin (MULTIVITAMIN) tablet Take 1 tablet by mouth daily.     valsartan  (DIOVAN ) 80 MG tablet Take 1 tablet (80 mg total) by mouth daily. 90 tablet 0   Vitamin D , Ergocalciferol , (DRISDOL ) 1.25 MG (50000 UNIT) CAPS capsule Take 1 capsule (50,000 Units total) by mouth every 7 (seven) days for 12 doses. 12 capsule 0   No current facility-administered medications on file prior to visit.    No Known Allergies  Social History   Socioeconomic History   Marital status: Married    Spouse name: Not on file   Number of children: Not on file   Years of education: Not on file   Highest education level: Not on file  Occupational History   Not on file  Tobacco Use   Smoking status: Never   Smokeless tobacco: Never  Vaping Use   Vaping status: Never Used  Substance and Sexual Activity   Alcohol use: Not Currently    Drug use: Never   Sexual activity: Yes    Birth control/protection: None  Other Topics Concern   Not on file  Social History Narrative   Not on file   Social Drivers of Health   Financial Resource Strain: Low Risk  (03/27/2024)   Overall Financial Resource Strain (CARDIA)    Difficulty of Paying Living Expenses: Not hard at all  Food Insecurity: No Food Insecurity (10/19/2023)   Hunger Vital Sign    Worried About Running Out of Food in the Last Year: Never true    Ran Out of Food in the Last Year: Never true  Transportation Needs: No Transportation Needs (10/19/2023)   PRAPARE - Administrator, Civil Service (Medical): No    Lack of Transportation (Non-Medical): No  Physical Activity: Insufficiently Active (03/27/2024)   Exercise Vital Sign    Days of Exercise per Week: 3 days    Minutes of Exercise per Session: 30 min  Stress: No Stress Concern Present (03/27/2024)   [employer] of Occupational Health - Occupational Stress Questionnaire    Feeling of Stress : Not at all  Social Connections: Socially Integrated (03/27/2024)   Social Connection and Isolation Panel [NHANES]    Frequency of Communication with Friends and Family: More than three times a week    Frequency of Social Gatherings with Friends and Family: More than three times  a week    Attends Religious Services: More than 4 times per year    Active Member of Clubs or Organizations: Yes    Attends Banker Meetings: More than 4 times per year    Marital Status: Married  Catering manager Violence: Not At Risk (10/19/2023)   Humiliation, Afraid, Rape, and Kick questionnaire    Fear of Current or Ex-Partner: No    Emotionally Abused: No    Physically Abused: No    Sexually Abused: No    Family History  Problem Relation Age of Onset   Colon cancer Neg Hx    Esophageal cancer Neg Hx    Rectal cancer Neg Hx    Stomach cancer Neg Hx     Past Surgical History:  Procedure Laterality Date    CHOLECYSTECTOMY      ROS: Review of Systems Negative except as stated above  PHYSICAL EXAM: BP 123/85   Pulse 89   Temp 98 F (36.7 C) (Oral)   Resp 16   Ht 5\' 6"  (1.676 m)   Wt 195 lb 3.2 oz (88.5 kg)   LMP 04/19/2024 (Exact Date)   SpO2 98%   BMI 31.51 kg/m   Physical Exam HENT:     Head: Normocephalic and atraumatic.     Nose: Nose normal.     Mouth/Throat:     Mouth: Mucous membranes are moist.     Pharynx: Oropharynx is clear.  Eyes:     Extraocular Movements: Extraocular movements intact.     Conjunctiva/sclera: Conjunctivae normal.     Pupils: Pupils are equal, round, and reactive to light.  Cardiovascular:     Rate and Rhythm: Normal rate and regular rhythm.     Pulses: Normal pulses.     Heart sounds: Normal heart sounds.  Pulmonary:     Effort: Pulmonary effort is normal.     Breath sounds: Normal breath sounds.  Musculoskeletal:        General: Normal range of motion.     Cervical back: Normal range of motion and neck supple.  Neurological:     General: No focal deficit present.     Mental Status: She is alert and oriented to person, place, and time.  Psychiatric:        Mood and Affect: Mood normal.        Behavior: Behavior normal.     ASSESSMENT AND PLAN: 1. Primary hypertension (Primary) - Continue Amlodipine  as prescribed.  - Counseled on blood pressure goal of less than 130/80, low-sodium, DASH diet, medication compliance, and 150 minutes of moderate intensity exercise per week as tolerated. Counseled on medication adherence and adverse effects. - Follow-up with primary provider in 3 months or sooner if needed. - amLODipine  (NORVASC ) 5 MG tablet; Take 1 tablet (5 mg total) by mouth daily.  Dispense: 90 tablet; Refill: 0   Patient was given the opportunity to ask questions.  Patient verbalized understanding of the plan and was able to repeat key elements of the plan. Patient was given clear instructions to go to Emergency Department or return  to medical center if symptoms don't improve, worsen, or new problems develop.The patient verbalized understanding.   Requested Prescriptions   Signed Prescriptions Disp Refills   amLODipine  (NORVASC ) 5 MG tablet 90 tablet 0    Sig: Take 1 tablet (5 mg total) by mouth daily.    Return in about 3 months (around 07/24/2024) for Follow-Up or next available chronic conditions.  Senaida Dama, NP

## 2024-04-23 NOTE — Progress Notes (Signed)
 4 week follow up

## 2024-05-28 ENCOUNTER — Encounter: Payer: Self-pay | Admitting: Family

## 2024-05-28 ENCOUNTER — Ambulatory Visit (INDEPENDENT_AMBULATORY_CARE_PROVIDER_SITE_OTHER): Admitting: Family

## 2024-05-28 VITALS — BP 136/88 | HR 86 | Temp 98.3°F | Resp 16 | Ht 67.0 in | Wt 192.0 lb

## 2024-05-28 DIAGNOSIS — Z131 Encounter for screening for diabetes mellitus: Secondary | ICD-10-CM

## 2024-05-28 DIAGNOSIS — Z01419 Encounter for gynecological examination (general) (routine) without abnormal findings: Secondary | ICD-10-CM | POA: Diagnosis not present

## 2024-05-28 DIAGNOSIS — Z7689 Persons encountering health services in other specified circumstances: Secondary | ICD-10-CM

## 2024-05-28 DIAGNOSIS — Z0000 Encounter for general adult medical examination without abnormal findings: Secondary | ICD-10-CM | POA: Diagnosis not present

## 2024-05-28 DIAGNOSIS — Z1329 Encounter for screening for other suspected endocrine disorder: Secondary | ICD-10-CM

## 2024-05-28 DIAGNOSIS — Z130 Encounter for screening for diseases of the blood and blood-forming organs and certain disorders involving the immune mechanism: Secondary | ICD-10-CM

## 2024-05-28 DIAGNOSIS — I10 Essential (primary) hypertension: Secondary | ICD-10-CM

## 2024-05-28 DIAGNOSIS — E559 Vitamin D deficiency, unspecified: Secondary | ICD-10-CM | POA: Diagnosis not present

## 2024-05-28 DIAGNOSIS — Z5320 Procedure and treatment not carried out because of patient's decision for unspecified reasons: Secondary | ICD-10-CM

## 2024-05-28 DIAGNOSIS — Z13228 Encounter for screening for other metabolic disorders: Secondary | ICD-10-CM | POA: Diagnosis not present

## 2024-05-28 DIAGNOSIS — Z6830 Body mass index (BMI) 30.0-30.9, adult: Secondary | ICD-10-CM | POA: Diagnosis not present

## 2024-05-28 DIAGNOSIS — Z1231 Encounter for screening mammogram for malignant neoplasm of breast: Secondary | ICD-10-CM | POA: Diagnosis not present

## 2024-05-28 NOTE — Progress Notes (Signed)
Vitamin D check

## 2024-05-28 NOTE — Progress Notes (Signed)
 Patient ID: Karla Krause, female    DOB: 01/07/1976  MRN: [id number]  CC: Annual Exam  Subjective: Karla Krause is a 48 y.o. female who presents for annual exam.  Her concerns today include:  - States she had an appointment with Gynecology earlier today for women's screening/health maintenance.  - Doing well on Amlodipine , no issues/concerns. States she would like referral to Cardiology for routine exam. She does not complain of red flag symptoms such as but not limited to chest pain, shortness of breath, worst headache of life, nausea/vomiting.  - Weight. States she is watching what she eats.  - Vitamin D  lab.  Patient Active Problem List   Diagnosis Date Noted   Hyperlipidemia 09/13/2023   Prediabetes 05/02/2023   Anemia 05/02/2023   Cervical radiculitis 01/22/2020     Current Outpatient Medications on File Prior to Visit  Medication Sig Dispense Refill   amLODipine  (NORVASC ) 5 MG tablet Take 1 tablet (5 mg total) by mouth daily. 90 tablet 0   atorvastatin  (LIPITOR) 20 MG tablet Take 1 tablet (20 mg total) by mouth daily. 90 tablet 0   fluticasone  (FLONASE ) 50 MCG/ACT nasal spray Place 2 sprays into both nostrils daily. 16 g 2   Iron , Ferrous Sulfate , 325 (65 Fe) MG TABS Take 1 tablet by mouth daily. 90 tablet 0   Multiple Vitamin (MULTIVITAMIN) tablet Take 1 tablet by mouth daily.     valsartan  (DIOVAN ) 80 MG tablet Take 1 tablet (80 mg total) by mouth daily. 90 tablet 0   Vitamin D , Ergocalciferol , (DRISDOL ) 1.25 MG (50000 UNIT) CAPS capsule Take 1 capsule (50,000 Units total) by mouth every 7 (seven) days for 12 doses. 12 capsule 0   No current facility-administered medications on file prior to visit.    No Known Allergies  Social History   Socioeconomic History   Marital status: Married    Spouse name: Not on file   Number of children: Not on file   Years of education: Not on file   Highest education level: Not on file  Occupational History   Not  on file  Tobacco Use   Smoking status: Never   Smokeless tobacco: Never  Vaping Use   Vaping status: Never Used  Substance and Sexual Activity   Alcohol use: Not Currently   Drug use: Never   Sexual activity: Yes    Birth control/protection: None  Other Topics Concern   Not on file  Social History Narrative   Not on file   Social Drivers of Health   Financial Resource Strain: Low Risk  (03/27/2024)   Overall Financial Resource Strain (CARDIA)    Difficulty of Paying Living Expenses: Not hard at all  Food Insecurity: No Food Insecurity (10/19/2023)   Hunger Vital Sign    Worried About Running Out of Food in the Last Year: Never true    Ran Out of Food in the Last Year: Never true  Transportation Needs: No Transportation Needs (10/19/2023)   PRAPARE - Administrator, Civil Service (Medical): No    Lack of Transportation (Non-Medical): No  Physical Activity: Insufficiently Active (03/27/2024)   Exercise Vital Sign    Days of Exercise per Week: 3 days    Minutes of Exercise per Session: 30 min  Stress: No Stress Concern Present (03/27/2024)   [employer] of Occupational Health - Occupational Stress Questionnaire    Feeling of Stress : Not at all  Social Connections: Socially Integrated (03/27/2024)   Social  Connection and Isolation Panel    Frequency of Communication with Friends and Family: More than three times a week    Frequency of Social Gatherings with Friends and Family: More than three times a week    Attends Religious Services: More than 4 times per year    Active Member of [employer] or Organizations: Yes    Attends Engineer, structural: More than 4 times per year    Marital Status: Married  Catering manager Violence: Not At Risk (10/19/2023)   Humiliation, Afraid, Rape, and Kick questionnaire    Fear of Current or Ex-Partner: No    Emotionally Abused: No    Physically Abused: No    Sexually Abused: No    Family History  Problem Relation Age of  Onset   Colon cancer Neg Hx    Esophageal cancer Neg Hx    Rectal cancer Neg Hx    Stomach cancer Neg Hx     Past Surgical History:  Procedure Laterality Date   CHOLECYSTECTOMY      ROS: Review of Systems Negative except as stated above  PHYSICAL EXAM: BP 136/88   Pulse 86   Temp 98.3 F (36.8 C) (Oral)   Resp 16   Ht 5' 7 (1.702 m)   Wt 192 lb (87.1 kg)   LMP 05/07/2024 (Exact Date)   SpO2 96%   BMI 30.07 kg/m   Physical Exam HENT:     Head: Normocephalic and atraumatic.     Right Ear: Tympanic membrane, ear canal and external ear normal.     Left Ear: Tympanic membrane, ear canal and external ear normal.     Nose: Nose normal.     Mouth/Throat:     Mouth: Mucous membranes are moist.     Pharynx: Oropharynx is clear.   Eyes:     Extraocular Movements: Extraocular movements intact.     Conjunctiva/sclera: Conjunctivae normal.     Pupils: Pupils are equal, round, and reactive to light.   Neck:     Thyroid : No thyroid  mass, thyromegaly or thyroid  tenderness.   Cardiovascular:     Rate and Rhythm: Normal rate and regular rhythm.     Pulses: Normal pulses.     Heart sounds: Normal heart sounds.  Pulmonary:     Effort: Pulmonary effort is normal.     Breath sounds: Normal breath sounds.  Chest:     Comments: Patient declined. Abdominal:     General: Bowel sounds are normal.     Palpations: Abdomen is soft.  Genitourinary:    Comments: Patient declined.  Musculoskeletal:        General: Normal range of motion.     Right shoulder: Normal.     Left shoulder: Normal.     Right upper arm: Normal.     Left upper arm: Normal.     Right elbow: Normal.     Left elbow: Normal.     Right forearm: Normal.     Left forearm: Normal.     Right wrist: Normal.     Left wrist: Normal.     Right hand: Normal.     Left hand: Normal.     Cervical back: Normal, normal range of motion and neck supple.     Thoracic back: Normal.     Lumbar back: Normal.     Right  hip: Normal.     Left hip: Normal.     Right upper leg: Normal.     Left upper leg:  Normal.     Right knee: Normal.     Left knee: Normal.     Right lower leg: Normal.     Left lower leg: Normal.     Right ankle: Normal.     Left ankle: Normal.     Right foot: Normal.     Left foot: Normal.   Skin:    General: Skin is warm and dry.     Capillary Refill: Capillary refill takes less than 2 seconds.   Neurological:     General: No focal deficit present.     Mental Status: She is alert and oriented to person, place, and time.   Psychiatric:        Mood and Affect: Mood normal.        Behavior: Behavior normal.     ASSESSMENT AND PLAN: 1. Annual physical exam (Primary) - Counseled on 150 minutes of exercise per week as tolerated, healthy eating (including decreased daily intake of saturated fats, cholesterol, added sugars, sodium), STI prevention, and routine healthcare maintenance.  2. Screening for metabolic disorder - Routine screening.  - Hepatic Function Panel  3. Screening for deficiency anemia - Routine screening.  - CBC  4. Diabetes mellitus screening - Routine screening.  - Hemoglobin A1c  5. Thyroid  disorder screen - Routine screening.  - Thyroid  Profile  6. Vitamin D  deficiency - Routine screening.  - Vitamin D , 25-hydroxy  7. Cervical cancer screening declined - Patient declined.   8. Primary hypertension - Continue  Amlodipine  as prescribed. No refills needed as of present.  - Counseled on blood pressure goal of less than 130/80, low-sodium, DASH diet, medication compliance, and 150 minutes of moderate intensity exercise per week as tolerated. Counseled on medication adherence and adverse effects. - Referral to Cardiology for evaluation/management.  - Follow-up with primary provider as scheduled. - Ambulatory referral to Cardiology  9. Encounter for weight management 10. BMI 30.0-30.9,adult - Patient declined pharmacological therapy.  - Patient  declined referral to Medical Weight Management.  Patient was given the opportunity to ask questions.  Patient verbalized understanding of the plan and was able to repeat key elements of the plan. Patient was given clear instructions to go to Emergency Department or return to medical center if symptoms don't improve, worsen, or new problems develop.The patient verbalized understanding.   Orders Placed This Encounter  Procedures   Hepatic Function Panel   CBC   Hemoglobin A1c   Thyroid  Profile   Vitamin D , 25-hydroxy   Ambulatory referral to Cardiology    Return in about 1 year (around 05/28/2025) for Physical per patient preference.  Senaida Dama, NP

## 2024-05-29 ENCOUNTER — Ambulatory Visit: Payer: Self-pay | Admitting: Family

## 2024-05-29 LAB — THYROID PANEL
Free Thyroxine Index: 2 (ref 1.2–4.9)
T3 Uptake Ratio: 24 % (ref 24–39)
T4, Total: 8.2 ug/dL (ref 4.5–12.0)

## 2024-05-29 LAB — HEPATIC FUNCTION PANEL
ALT: 18 IU/L (ref 0–32)
AST: 19 IU/L (ref 0–40)
Albumin: 4.2 g/dL (ref 3.9–4.9)
Alkaline Phosphatase: 73 IU/L (ref 44–121)
Bilirubin Total: 0.2 mg/dL (ref 0.0–1.2)
Bilirubin, Direct: 0.11 mg/dL (ref 0.00–0.40)
Total Protein: 6.8 g/dL (ref 6.0–8.5)

## 2024-05-29 LAB — CBC
Hematocrit: 38.4 % (ref 34.0–46.6)
Hemoglobin: 12 g/dL (ref 11.1–15.9)
MCH: 28.8 pg (ref 26.6–33.0)
MCHC: 31.3 g/dL — ABNORMAL LOW (ref 31.5–35.7)
MCV: 92 fL (ref 79–97)
Platelets: 354 x10E3/uL (ref 150–450)
RBC: 4.16 x10E6/uL (ref 3.77–5.28)
RDW: 12.8 % (ref 11.7–15.4)
WBC: 5.1 x10E3/uL (ref 3.4–10.8)

## 2024-05-29 LAB — HEMOGLOBIN A1C
Est. average glucose Bld gHb Est-mCnc: 120 mg/dL
Hgb A1c MFr Bld: 5.8 % — ABNORMAL HIGH (ref 4.8–5.6)

## 2024-05-29 LAB — VITAMIN D 25 HYDROXY (VIT D DEFICIENCY, FRACTURES): Vit D, 25-Hydroxy: 56.5 ng/mL (ref 30.0–100.0)

## 2024-07-18 ENCOUNTER — Other Ambulatory Visit (HOSPITAL_COMMUNITY): Payer: Self-pay

## 2024-08-14 ENCOUNTER — Other Ambulatory Visit (HOSPITAL_COMMUNITY): Payer: Self-pay

## 2024-12-18 ENCOUNTER — Encounter (HOSPITAL_BASED_OUTPATIENT_CLINIC_OR_DEPARTMENT_OTHER): Payer: Self-pay | Admitting: Cardiovascular Disease

## 2024-12-18 ENCOUNTER — Ambulatory Visit (HOSPITAL_BASED_OUTPATIENT_CLINIC_OR_DEPARTMENT_OTHER): Admitting: Cardiovascular Disease

## 2024-12-18 ENCOUNTER — Other Ambulatory Visit (HOSPITAL_COMMUNITY): Payer: Self-pay

## 2024-12-18 VITALS — BP 160/100 | HR 81 | Ht 66.0 in | Wt 208.0 lb

## 2024-12-18 DIAGNOSIS — E785 Hyperlipidemia, unspecified: Secondary | ICD-10-CM

## 2024-12-18 DIAGNOSIS — I10 Essential (primary) hypertension: Secondary | ICD-10-CM | POA: Diagnosis not present

## 2024-12-18 MED ORDER — ATORVASTATIN CALCIUM 20 MG PO TABS
20.0000 mg | ORAL_TABLET | Freq: Every day | ORAL | 3 refills | Status: AC
  Filled 2024-12-18: qty 90, 90d supply, fill #0

## 2024-12-18 MED ORDER — AMLODIPINE BESYLATE 5 MG PO TABS
5.0000 mg | ORAL_TABLET | Freq: Every day | ORAL | 3 refills | Status: AC
  Filled 2024-12-18: qty 90, 90d supply, fill #0

## 2024-12-18 NOTE — Progress Notes (Signed)
 "  Advanced [facility]pertension [facility] Initial Assessment:    Date:  12/18/2024   ID:  Karla Krause, DOB [birth year], MRN [id number]  PCP:  Jaycee Greig PARAS, NP  Cardiologist:  None  Nephrologist:  Referring MD: Rutherford Gain, MD   CC: Hypertension  History of Present Illness:    Karla Krause is a 49 y.o. female with a hx of hypertension, hyperlipidemia, and prediabetes here to establish care in the [facility].  She saw Dr. Rutherford 09/2024 and blood pressure was 133/82 on valsartan  and amlodipine .  Discussed the use of AI scribe software for clinical note transcription with the patient, who gave verbal consent to proceed.  History of Present Illness Karla Krause notes that hypertension was first noted late last year, and her blood pressure was elevated during today's visit. She has been on amlodipine , which lowered her readings to about 130 mmHg systolic. However, she has not been taking valsartan  or atorvastatin  due to pharmacy refill issues since early November.  She has a family history of hypertension, with her mother and sister affected, her mother developing it in her late 29s to 55s. There is no known family history of heart disease.  She has not been exercising as much as she used to, primarily due to lifestyle changes as her children have grown up. She experiences tiredness when climbing stairs but denies chest pain or leg swelling. She primarily cooks at home, avoids adding salt, and does not consume caffeine, alcohol, or smoke. She takes extra strength Tylenol  for pain and a multivitamin along with elderberry supplements.  She reports occasional snoring, which her husband notices, and experiences some days of feeling restless upon waking. She does not fall asleep during movies.  Previous antihypertensives:    Past Medical History:  Diagnosis Date   Allergy    Hyperlipidemia    Hypertension    Primary hypertension 12/18/2024    Past  Surgical History:  Procedure Laterality Date   CHOLECYSTECTOMY      Current Medications: Active Medications[1]   Allergies:   Patient has no known allergies.   Social History   Socioeconomic History   Marital status: Married    Spouse name: Not on file   Number of children: Not on file   Years of education: Not on file   Highest education level: Not on file  Occupational History   Not on file  Tobacco Use   Smoking status: Never    Passive exposure: Never   Smokeless tobacco: Never  Vaping Use   Vaping status: Never Used  Substance and Sexual Activity   Alcohol use: Never   Drug use: Never   Sexual activity: Yes    Birth control/protection: None  Other Topics Concern   Not on file  Social History Narrative   Not on file   Social Drivers of Health   Tobacco Use: Low Risk (12/18/2024)   Patient History    Smoking Tobacco Use: Never    Smokeless Tobacco Use: Never    Passive Exposure: Never  Financial Resource Strain: Low Risk (12/18/2024)   Overall Financial Resource Strain (CARDIA)    Difficulty of Paying Living Expenses: Not hard at all  Food Insecurity: No Food Insecurity (12/18/2024)   Epic    Worried About Radiation Protection Practitioner of Food in the Last Year: Never true    Ran Out of Food in the Last Year: Never true  Transportation Needs: No Transportation Needs (12/18/2024)   Epic    Lack of Transportation (  Medical): No    Lack of Transportation (Non-Medical): No  Physical Activity: Insufficiently Active (12/18/2024)   Exercise Vital Sign    Days of Exercise per Week: 1 day    Minutes of Exercise per Session: 30 min  Stress: No Stress Concern Present (12/18/2024)   [employer] of Occupational Health - Occupational Stress Questionnaire    Feeling of Stress: Not at all  Social Connections: Socially Integrated (12/18/2024)   Social Connection and Isolation Panel    Frequency of Communication with Friends and Family: Three times a week    Frequency of Social Gatherings with  Friends and Family: Once a week    Attends Religious Services: More than 4 times per year    Active Member of Clubs or Organizations: Yes    Attends Banker Meetings: More than 4 times per year    Marital Status: Married  Depression (PHQ2-9): Low Risk (05/28/2024)   Depression (PHQ2-9)    PHQ-2 Score: 0  Alcohol Screen: Low Risk (12/18/2024)   Alcohol Screen    Last Alcohol Screening Score (AUDIT): 0  Housing: Low Risk (12/18/2024)   Epic    Unable to Pay for Housing in the Last Year: No    Number of Times Moved in the Last Year: 0    Homeless in the Last Year: No  Utilities: Not At Risk (12/18/2024)   Epic    Threatened with loss of utilities: No  Health Literacy: Adequate Health Literacy (12/18/2024)   B1300 Health Literacy    Frequency of need for help with medical instructions: Never     Family History: The patient's family history includes Cancer in her father; Diabetes in her father, mother, and sister; Hypertension in her mother and sister; Kidney disease in her sister. There is no history of Colon cancer, Esophageal cancer, Rectal cancer, or Stomach cancer.  ROS:   Please see the history of present illness.     All other systems reviewed and are negative.  EKGs/Labs/Other Studies Reviewed:    EKG:  EKG is ordered today.    EKG Interpretation Date/Time:  Tuesday December 18 2024 14:11:24 EST Ventricular Rate:  81 PR Interval:  152 QRS Duration:  88 QT Interval:  358 QTC Calculation: 415 R Axis:   58  Text Interpretation: Normal sinus rhythm Normal ECG No previous ECGs available Confirmed by Raford Riggs ([id number]) on 12/18/2024 2:29:46 PM         Recent Labs: 03/27/2024: BUN 8; Creatinine, Ser 0.56; Potassium 4.2; Sodium 140; TSH 1.610 05/28/2024: ALT 18; Hemoglobin 12.0; Platelets 354   Recent Lipid Panel    Component Value Date/Time   CHOL 148 10/19/2023 1610   TRIG 84 10/19/2023 1610   HDL 81 10/19/2023 1610   CHOLHDL 1.8 10/19/2023 1610    LDLCALC 51 10/19/2023 1610    Physical Exam:   VS:  BP (!) 160/100 (BP Location: Left Arm, Patient Position: Sitting, Cuff Size: Large)   Pulse 81   Ht 5' 6 (1.676 m)   Wt 208 lb (94.3 kg)   SpO2 96%   BMI 33.57 kg/m  , BMI Body mass index is 33.57 kg/m. GENERAL:  Well appearing HEENT: Pupils equal round and reactive, fundi not visualized, oral mucosa unremarkable NECK:  No jugular venous distention, waveform within normal limits, carotid upstroke brisk and symmetric, no bruits, no thyromegaly LUNGS:  Clear to auscultation bilaterally HEART:  RRR.  PMI not displaced or sustained,S1 and S2 within normal limits, no S3, no S4, no  clicks, no rubs, no murmurs ABD:  Flat, positive bowel sounds normal in frequency in pitch, no bruits, no rebound, no guarding, no midline pulsatile mass, no hepatomegaly, no splenomegaly EXT:  2 plus pulses throughout, no edema, no cyanosis no clubbing SKIN:  No rashes no nodules NEURO:  Cranial nerves II through XII grossly intact, motor grossly intact throughout PSYCH:  Cognitively intact, oriented to person place and time   ASSESSMENT/PLAN:    Assessment & Plan # Primary hypertension Blood pressure previously controlled with amlodipine . Emphasized maintaining BP <130/80 mmHg to reduce cardiovascular and renal risks. - Refilled amlodipine  prescription.  She never started valsartan  that was previously prescribed.  - Encouraged lifestyle modifications: regular exercise (150 minutes/week) and dietary changes to reduce salt and carbohydrates. - Provided blood pressure tracking book. - Scheduled follow-up in 1-2 months to reassess BP control.  She understands that we will likely need to add additional medication if her blood pressure remains uncontrolled.  # Hyperlipidemia Cholesterol levels previously well-controlled with atorvastatin . - Refilled atorvastatin  prescription.  # Obesity:  BMI 33.  We discussed increasing exercise and limiting carbohydrate  intake.    Screening for Secondary Hypertension:     12/18/2024    2:34 PM  Causes  Drugs/Herbals Screened     - Comments no caffeine, tobacco or EtoH. limits sodium.  Renovascular HTN N/A  Sleep Apnea N/A  Thyroid  Disease Screened  Hyperaldosteronism N/A  Pheochromocytoma N/A    Relevant Labs/Studies:    Latest Ref Rng & Units 03/27/2024    3:25 PM 11/04/2023    1:54 PM 10/19/2023    4:10 PM  Basic Labs  Sodium 134 - 144 mmol/L 140  141  139   Potassium 3.5 - 5.2 mmol/L 4.2  4.7  4.3   Creatinine 0.57 - 1.00 mg/dL 9.43  9.35  9.23        Latest Ref Rng & Units 03/27/2024    3:25 PM 04/29/2023    2:16 PM  Thyroid    TSH 0.450 - 4.500 uIU/mL 1.610  1.680      Disposition:    FU with MD/PharmD in 1-2 months   Medication Adjustments/Labs and Tests Ordered: Current medicines are reviewed at length with the patient today.  Concerns regarding medicines are outlined above.  Orders Placed This Encounter  Procedures   EKG 12-Lead   Meds ordered this encounter  Medications   atorvastatin  (LIPITOR) 20 MG tablet    Sig: Take 1 tablet (20 mg total) by mouth daily.    Dispense:  90 tablet    Refill:  3   amLODipine  (NORVASC ) 5 MG tablet    Sig: Take 1 tablet (5 mg total) by mouth daily.    Dispense:  90 tablet    Refill:  3     Signed, Annabella Scarce, MD  12/18/2024 3:47 PM    [facility]     [1]  Current Meds  Medication Sig   fluticasone  (FLONASE ) 50 MCG/ACT nasal spray Place 2 sprays into both nostrils daily.   Multiple Vitamin (MULTIVITAMIN) tablet Take 1 tablet by mouth daily.   "

## 2024-12-18 NOTE — Patient Instructions (Addendum)
 Medication Instructions:  RESUME ATORVASTATIN  AND AMLODIPINE  DAILY   Labwork: NONE  Testing/Procedures: NONE  Follow-Up: 6 TO 8 WEEKS WITH DR Prentiss, CAITLIN W NP, OR KRISTIN A PHARM D  Any Other Special Instructions Will Be Listed Below (If Applicable).  MONITOR YOUR BLOOD PRESSURE TWICE A DAY, LOG IN THE BOOK PROVIDED. BRING THE BOOK AND YOUR BLOOD PRESSURE MACHINE TO YOUR FOLLOW UP   Exercise recommendations: The American Heart Association recommends 150 minutes of moderate intensity exercise weekly. Try 30 minutes of moderate intensity exercise 4-5 times per week. This could include walking, jogging, or swimming.  LIMIT YOUR CARBOHYDRATES   If you need a refill on your cardiac medications before your next appointment, please call your pharmacy.

## 2024-12-19 ENCOUNTER — Other Ambulatory Visit (HOSPITAL_COMMUNITY): Payer: Self-pay

## 2025-01-08 ENCOUNTER — Encounter (INDEPENDENT_AMBULATORY_CARE_PROVIDER_SITE_OTHER): Payer: Self-pay | Admitting: Nurse Practitioner

## 2025-01-08 ENCOUNTER — Ambulatory Visit (INDEPENDENT_AMBULATORY_CARE_PROVIDER_SITE_OTHER): Admitting: Nurse Practitioner

## 2025-01-08 VITALS — BP 144/85 | HR 83 | Temp 98.2°F | Ht 65.0 in | Wt 205.0 lb

## 2025-01-08 DIAGNOSIS — Z6834 Body mass index (BMI) 34.0-34.9, adult: Secondary | ICD-10-CM | POA: Diagnosis not present

## 2025-01-08 DIAGNOSIS — E66811 Obesity, class 1: Secondary | ICD-10-CM

## 2025-01-08 DIAGNOSIS — R7303 Prediabetes: Secondary | ICD-10-CM | POA: Diagnosis not present

## 2025-01-08 DIAGNOSIS — Z0289 Encounter for other administrative examinations: Secondary | ICD-10-CM

## 2025-01-08 DIAGNOSIS — E559 Vitamin D deficiency, unspecified: Secondary | ICD-10-CM

## 2025-01-08 DIAGNOSIS — E785 Hyperlipidemia, unspecified: Secondary | ICD-10-CM | POA: Diagnosis not present

## 2025-01-08 DIAGNOSIS — I10 Essential (primary) hypertension: Secondary | ICD-10-CM | POA: Diagnosis not present

## 2025-01-08 NOTE — Progress Notes (Signed)
 " [address] [place], [place], [place] [address] Office: [phone]  /  Fax: [phone]   Initial Consultation    Karla Krause was seen in clinic today to evaluate for obesity. She is interested in losing weight to improve overall health and reduce the risk of weight related complications. She presents today to review program treatment options, initial physical assessment, and evaluation.    Lavren does have hypertension and has recently started following with the hypertension clinic. She recently restarted Amlodipine  5 mg every day but BP's are not yet controlled. Denies headaches, chest pain shortness of breath at rest and dizziness  BP Readings from Last 3 Encounters:  01/08/25 (!) 144/85  12/18/24 (!) 160/100  05/28/24 136/88   She does have hyperlipidemia and takes Atorvastatin  20 mg every day and denies side effects with this medication. Her last lipid panel showed LDL at goal Lab Results  Component Value Date   CHOL 148 10/19/2023   HDL 81 10/19/2023   LDLCALC 51 10/19/2023   TRIG 84 10/19/2023   CHOLHDL 1.8 10/19/2023    She also has prediabetes and is trying to work on nutrition, exercise and weight loss to improve glucose levels.  Lab Results  Component Value Date   HGBA1C 5.8 (H) 05/28/2024    She has a history of Vit D deficiency, was prescribed Ergocalciferol  50000 units once a week in the past. Not currently on supplementation.   Anthropometrics and Bioimpedance Analysis   Body mass index is 34.11 kg/m. Body Fat Mass : 43.1 % Visceral Fat Mass Rating : 11   Obesity Related Diseases and Complications  Obesity Quality of Life and Psychosocial Complications: Body image dissatisfaction and Reduced health-related quality of life  Cardiometabolic: Prediabetes and/or insulin resistance, Dyslipidemia or hypercholesterolemia, Hypertension, DOE, and Fatigue  Biomechanical: None   Weight Related History  She was referred by: Friend or Family  When  asked what they would like to accomplish? She states: Adopt a healthier eating pattern and lifestyle, Improve energy levels and physical activity, Improve existing medical conditions, Improve quality of life, Improve appearance, and Improve self-confidence  Weight history: After birth of her kids 54, 25, 21-did not get back to prepregnancy weight with any of her pregnancies.   Highest weight: 220  Contributing factors: family history of obesity, moderate to high levels of stress, chronic skipping of meals, slow metabolism for age, need for convenience due to lack of time, multiple weight loss attempts in the past, hectic pace of life, and need for convenient foods  Prior weight loss attempts: Intermittent fasting  Current or previous pharmacotherapy: None and Is interested in pharmacotherapy  Response to medication: Never tried medications  Current nutrition plan: None  Greatest challenge with dieting: difficulty maintaining reduced calorie state.  Current level of physical activity: Walking 30-60 minutes, twice a week  Barriers to Exercise: no barriers  Readiness and Motivation  On a scale from 0 to 10 How ready are you to make changes to your eating and physical activity to lose weight? 10 How important is it for you to lose weight right now ? 10 How confident are you that you can lose weight if you try? 10  Past Medical History   Past Medical History:  Diagnosis Date   Allergy    Hyperlipidemia    Hypertension    Primary hypertension 12/18/2024     Objective    BP (!) 144/85 Comment: takes bp meds at night  Pulse 83   Temp 98.2 F (  36.8 C)   Ht 5' 5 (1.651 m)   Wt 205 lb (93 kg)   SpO2 98%   BMI 34.11 kg/m  She was weighed on the bioimpedance scale: Body mass index is 34.11 kg/m.    General:  Alert, oriented and cooperative. Patient is in no acute distress.  Respiratory: Normal respiratory effort, no problems with respiration noted   Gait: able to ambulate  independently  Mental Status: Normal mood and affect. Normal behavior. Normal judgment and thought content.   Diagnostic Data Reviewed  Last metabolic panel Lab Results  Component Value Date   GLUCOSE 76 03/27/2024   NA 140 03/27/2024   K 4.2 03/27/2024   CL 104 03/27/2024   CO2 20 03/27/2024   BUN 8 03/27/2024   CREATININE 0.56 (L) 03/27/2024   EGFR 113 03/27/2024   CALCIUM  9.2 03/27/2024   PROT 6.8 05/28/2024   ALBUMIN 4.2 05/28/2024   LABGLOB 3.0 04/29/2023   AGRATIO 1.5 04/29/2023   BILITOT 0.2 05/28/2024   ALKPHOS 73 05/28/2024   AST 19 05/28/2024   ALT 18 05/28/2024     Lab Results  Component Value Date   HGBA1C 5.8 (H) 05/28/2024   HGBA1C 5.8 (H) 04/01/2021   No results found for: INSULIN CBC    Component Value Date/Time   WBC 5.1 05/28/2024 1548   RBC 4.16 05/28/2024 1548   HGB 12.0 05/28/2024 1548   HCT 38.4 05/28/2024 1548   PLT 354 05/28/2024 1548   MCV 92 05/28/2024 1548   MCH 28.8 05/28/2024 1548   MCHC 31.3 (L) 05/28/2024 1548   RDW 12.8 05/28/2024 1548    Lipid Panel     Component Value Date/Time   CHOL 148 10/19/2023 1610   TRIG 84 10/19/2023 1610   HDL 81 10/19/2023 1610   CHOLHDL 1.8 10/19/2023 1610   LDLCALC 51 10/19/2023 1610   Last vitamin D  Lab Results  Component Value Date   VD25OH 56.5 05/28/2024   No results found for: VITAMINB12    Medications  Outpatient Encounter Medications as of 01/08/2025  Medication Sig   amLODipine  (NORVASC ) 5 MG tablet Take 1 tablet (5 mg total) by mouth daily.   atorvastatin  (LIPITOR) 20 MG tablet Take 1 tablet (20 mg total) by mouth daily.   fluticasone  (FLONASE ) 50 MCG/ACT nasal spray Place 2 sprays into both nostrils daily.   Multiple Vitamin (MULTIVITAMIN) tablet Take 1 tablet by mouth daily.   Iron , Ferrous Sulfate , 325 (65 Fe) MG TABS Take 1 tablet by mouth daily. (Patient not taking: Reported on 01/08/2025)   No facility-administered encounter medications on file as of 01/08/2025.      Assessment and Plan   Primary hypertension Continue amlodipine  5 mg every day Continue  DASH diet Monitor BP and if consistently >140/90 notify PCP If develops headaches, chest pain, shortness of breath or dizziness go to ER Continue to follow regularly with PCP Loss of 10-15% body weight can help improve blood pressures   Hyperlipidemia, unspecified hyperlipidemia type Focus on limiting saturated fats Loss of 10-15% body weight can improve lipid levels Continue Atorvastatin  20 mg every day- denies side effects Continue to follow regularly with PCP  Prediabetes Limit simple carbohydrates Decreasing body weight by 10-15% can improve glucose levels Continue to follow regularly with PCP  Vitamin D  deficiency       Currently on no supplementation, recheck at first visit    Class 1 obesity with serious comorbidity and body mass index (BMI) of 34.0 to 34.9 in  adult, unspecified obesity type Obesity Treatment and Action Plan:  Patient will work on garnering support from family and friends to begin weight loss journey. Will work on eliminating or reducing the presence of highly palatable, calorie dense foods in the home. Will complete provided nutritional and psychosocial assessment questionnaire before the next appointment. Will be scheduled for indirect calorimetry to determine resting energy expenditure in a fasting state.  This will allow us  to create a reduced calorie, high-protein meal plan to promote loss of fat mass while preserving muscle mass. Counseled on the health benefits of losing 5%-15% of total body weight. Was counseled on nutritional approaches to weight loss and benefits of reducing processed foods and consuming plant-based foods and high quality protein as part of nutritional weight management. Was counseled on pharmacotherapy and role as an adjunct in weight management.   Education and Additional resources  She was weighed on the bioimpedance scale and results  were discussed and documented in the synopsis.  We discussed obesity as a progressive, chronic disease and the importance of a more detailed evaluation of all the factors contributing to the disease.  We reviewed the basic principles in obesity management.   We discussed the importance of long term lifestyle changes which include nutrition, exercise and behavioral modification as well as the importance of customizing this to her specific health and social needs.  We reviewed the role of medical interventions including pharmacotherapy and surgical interventions.   We discussed the benefits of reaching a healthier weight to alleviate the symptoms of existing conditions and reduce the risks of the biomechanical, cardiometabolic and psychological effects of obesity.  We reviewed our program approach and philosophy, which are guided by the four pillars of obesity medicine.  We discussed how to prepare for intake appointment and the importance of fasting and avoidance of stimulants for at least 8 hours prior to indirect calorimetry.  Estephani N Doland appears to be in the action stage of change and reports being ready to initiate intensive lifestyle and behavioral modifications as part of their weight loss journey.  Attestation  Reviewed by clinician on day of visit: allergies, medications, problem list, medical history, surgical history, family history, social history, and previous encounter notes pertinent to obesity diagnosis.  I personally spent a total of 24 minutes in the care of the patient today including preparing to see the patient, getting/reviewing separately obtained history, performing a medically appropriate exam/evaluation, counseling and educating, and documenting clinical information in the EHR.   Lonell Liverpool ANP-C "

## 2025-01-09 ENCOUNTER — Institutional Professional Consult (permissible substitution) (INDEPENDENT_AMBULATORY_CARE_PROVIDER_SITE_OTHER): Admitting: Nurse Practitioner

## 2025-01-11 ENCOUNTER — Ambulatory Visit (INDEPENDENT_AMBULATORY_CARE_PROVIDER_SITE_OTHER): Admitting: Nurse Practitioner

## 2025-01-11 ENCOUNTER — Encounter (INDEPENDENT_AMBULATORY_CARE_PROVIDER_SITE_OTHER): Payer: Self-pay | Admitting: Nurse Practitioner

## 2025-01-11 VITALS — BP 141/81 | HR 82 | Temp 98.1°F | Ht 65.5 in | Wt 202.0 lb

## 2025-01-11 DIAGNOSIS — E559 Vitamin D deficiency, unspecified: Secondary | ICD-10-CM | POA: Diagnosis not present

## 2025-01-11 DIAGNOSIS — Z1331 Encounter for screening for depression: Secondary | ICD-10-CM | POA: Diagnosis not present

## 2025-01-11 DIAGNOSIS — R0602 Shortness of breath: Secondary | ICD-10-CM | POA: Insufficient documentation

## 2025-01-11 DIAGNOSIS — R5383 Other fatigue: Secondary | ICD-10-CM | POA: Diagnosis not present

## 2025-01-11 DIAGNOSIS — E785 Hyperlipidemia, unspecified: Secondary | ICD-10-CM

## 2025-01-11 DIAGNOSIS — Z6833 Body mass index (BMI) 33.0-33.9, adult: Secondary | ICD-10-CM

## 2025-01-11 DIAGNOSIS — I10 Essential (primary) hypertension: Secondary | ICD-10-CM

## 2025-01-11 DIAGNOSIS — E66811 Obesity, class 1: Secondary | ICD-10-CM | POA: Diagnosis not present

## 2025-01-11 DIAGNOSIS — R7303 Prediabetes: Secondary | ICD-10-CM

## 2025-01-11 NOTE — Progress Notes (Signed)
 " [address] [place],  [place], [place] [address]  Office: [phone]  /  Fax: [phone]   Subjective   Initial Visit  Karla Krause (MR# [id number]) is a 49 y.o. female who presents for evaluation and treatment of obesity and related comorbidities. Current BMI is Body mass index is 33.1 kg/m. Karla Krause has been struggling with her weight for many years and has been unsuccessful in either losing weight, maintaining weight loss, or reaching her healthy weight goal.  Karla Krause is currently in the action stage of change and ready to dedicate time achieving and maintaining a healthier weight. Karla Krause is interested in becoming our patient and working on intensive lifestyle modifications including (but not limited to) diet and exercise for weight loss.  [name] has hypertension and has recently started following with the hypertension clinic. She recently restarted Amlodipine  5 mg every day but BP's are not yet controlled. Denies headaches, chest pain shortness of breath at rest and dizziness  BP Readings from Last 3 Encounters:  01/11/25 (!) 141/81  01/08/25 (!) 144/85  12/18/24 (!) 160/100    She has hyperlipidemia and takes Atorvastatin  20 mg every day and denies side effects with this medication. Her last lipid panel showed LDL at goal Lab Results  Component Value Date   CHOL 148 10/19/2023   HDL 81 10/19/2023   LDLCALC 51 10/19/2023   TRIG 84 10/19/2023   CHOLHDL 1.8 10/19/2023    Karla Krause also has prediabetes and is trying to work on nutrition, exercise and weight loss to improve glucose levels.  Lab Results  Component Value Date   HGBA1C 5.8 (H) 05/28/2024    She has a history of Vit D deficiency, was prescribed Ergocalciferol  50000 units once a week in the past. Not currently on supplementation.  Last vitamin D  Lab Results  Component Value Date   VD25OH 56.5 05/28/2024     Weight history:  When asked how their weight has affected their life and health, she states:  Has affected self-esteem, Contributed to medical problems, Contributed to orthopedic problems or mobility issues, Having fatigue, and Having poor endurance  When asked what else they would like to accomplish? She states: Adopt a healthier eating pattern and lifestyle, Improve energy levels and physical activity, Improve existing medical conditions, Improve quality of life, Improve appearance, and Improve self-confidence  She starting to note weight gain during : adulthood.  Life events associated with weight gain include : pregnancy.   Other contributing factors: family history of obesity, moderate to high levels of stress, chronic skipping of meals, slow metabolism for age, need for convenience due to lack of time, multiple weight loss attempts in the past, hectic pace of life, and need for convenient foods.  Their highest weight has been:  220 lbs.  Desired weight: 180  Previous weight-loss programs : Intermittent fasting.  Their greatest challenge with dieting: difficulty maintaining reduced calorie state.  Current or previous pharmacotherapy: None and Is interested in pharmacotherapy.  Response to medication: Never tried medications   Nutritional History:  Current nutrition plan: None.  How many times do you eat outside the home: 1-2 per week  How often do they skip meals: skips breakfast  What beverages do they drink: water, diet soda , and unsweetened tea.   Use of artificial sweetners : No  Food intolerances or dislikes: peas, liver and fried eggs.  Food triggers: Stress.  Food cravings: Starches / Carbohydrates  Do they struggle with excessive hunger or portion control : No  Physical Activity:  Current level of physical activity: Walking 60 minutes, twice a week  Barriers to Exercise: no barriers   Past medical history includes:   Past Medical History:  Diagnosis Date   Allergy    Hyperlipidemia    Hypertension    Low iron     Prediabetes    Primary  hypertension 12/18/2024   Vitamin D  deficiency      Objective   BP (!) 141/81   Pulse 82   Temp 98.1 F (36.7 C)   Ht 5' 5.5 (1.664 m)   Wt 202 lb (91.6 kg)   LMP 12/24/2024 (Exact Date)   SpO2 98%   BMI 33.10 kg/m  She was weighed on the bioimpedance scale: Body mass index is 33.1 kg/m.    Anthropometrics:  Vitals Temp: 98.1 F (36.7 C) BP: (!) 141/81 Pulse Rate: 82 SpO2: 98 %   Anthropometric Measurements Height: 5' 5.5 (1.664 m) Weight: 202 lb (91.6 kg) BMI (Calculated): 33.09 Peak Weight: 205 lb Waist Measurement : 36 inches   Body Composition  Body Fat %: 42.1 % Fat Mass (lbs): 85.2 lbs Muscle Mass (lbs): 111.2 lbs Total Body Water (lbs): 74 lbs Visceral Fat Rating : 10   Other Clinical Data Fasting: yes Labs: yes Today's Visit #: 1 Starting Date: 01/11/25    Physical Exam:  General: She is overweight, cooperative, alert, well developed, and in no acute distress. PSYCH: Has normal mood, affect and thought process.   HEENT: EOMI, sclerae are anicteric. Lungs: Normal breathing effort, no conversational dyspnea. Extremities: No edema.  Neurologic: No gross sensory or motor deficits. No tremors or fasciculations noted.    Diagnostic Data Reviewed  EKG: personally reviewed 12/18/24 EKG: Normal sinus rhythm, rate 81. No conduction abnormalities, abnormal Q waves or chamber enlargement.  Indirect Calorimeter completed today shows a VO2 of 245 and a REE of 1685.  Her calculated basal metabolic rate is 8372 thus her resting energy expenditure same as calculated.  Depression Screen  Emrie's PHQ-9 score was: 0.     01/11/2025    8:08 AM  Depression screen PHQ 2/9  Decreased Interest 0  Down, Depressed, Hopeless 0  PHQ - 2 Score 0  Altered sleeping 0  Tired, decreased energy 0  Change in appetite 0  Feeling bad or failure about yourself  0  Trouble concentrating 0  Moving slowly or fidgety/restless 0  Suicidal thoughts 0  PHQ-9 Score 0   Difficult doing work/chores Not difficult at all    Screening for Sleep Related Breathing Disorders  Karla Krause reports daytime somnolence and denies waking up still tired. Patient has a history of symptoms of daytime fatigue and hypertension. Karla Krause generally gets 6 or 7 hours of sleep per night, and states that she has generally restful sleep. Snoring is not present. Apneic episodes are not present. Epworth Sleepiness Score is 8.   BMET    Component Value Date/Time   NA 140 03/27/2024 1525   K 4.2 03/27/2024 1525   CL 104 03/27/2024 1525   CO2 20 03/27/2024 1525   GLUCOSE 76 03/27/2024 1525   BUN 8 03/27/2024 1525   CREATININE 0.56 (L) 03/27/2024 1525   CALCIUM  9.2 03/27/2024 1525   Lab Results  Component Value Date   HGBA1C 5.8 (H) 05/28/2024   HGBA1C 5.8 (H) 04/01/2021   No results found for: INSULIN CBC    Component Value Date/Time   WBC 5.1 05/28/2024 1548   RBC 4.16 05/28/2024 1548   HGB 12.0 05/28/2024  1548   HCT 38.4 05/28/2024 1548   PLT 354 05/28/2024 1548   MCV 92 05/28/2024 1548   MCH 28.8 05/28/2024 1548   MCHC 31.3 (L) 05/28/2024 1548   RDW 12.8 05/28/2024 1548   Iron /TIBC/Ferritin/ %Sat No results found for: IRON , TIBC, FERRITIN, IRONPCTSAT Lipid Panel     Component Value Date/Time   CHOL 148 10/19/2023 1610   TRIG 84 10/19/2023 1610   HDL 81 10/19/2023 1610   CHOLHDL 1.8 10/19/2023 1610   LDLCALC 51 10/19/2023 1610   Hepatic Function Panel     Component Value Date/Time   PROT 6.8 05/28/2024 1548   ALBUMIN 4.2 05/28/2024 1548   AST 19 05/28/2024 1548   ALT 18 05/28/2024 1548   ALKPHOS 73 05/28/2024 1548   BILITOT 0.2 05/28/2024 1548   BILIDIR 0.11 05/28/2024 1548      Component Value Date/Time   TSH 1.610 03/27/2024 1525     Assessment and Plan   TREATMENT PLAN FOR OBESITY:  Recommended Dietary Goals  Marne is currently in the action stage of change. As such, her goal is to implement medically supervised obesity  management plan.  She has agreed to implement: the Category 2 plan - 1200 kcal per day  Behavioral Intervention  We discussed the following Behavioral Modification Strategies today: increasing lean protein intake to established goals, decreasing simple carbohydrates , increasing vegetables, increasing lower glycemic fruits, increasing fiber rich foods, avoiding skipping meals, increasing water intake, work on meal planning and preparation, reading food labels , keeping healthy foods at home, identifying sources and decreasing liquid calories, decreasing eating out or consumption of processed foods, and making healthy choices when eating convenient foods, planning for success, and better snacking choices  Additional resources provided today: Handout on healthy eating and balanced plate, Handout on complex carbohydrates and lean sources of protein, Category 2 packet, and Handout principles of weight management  Recommended Physical Activity Goals  Adalyn has been advised to work up to 150 minutes of moderate intensity aerobic activity a week and strengthening exercises 2-3 times per week for cardiovascular health, weight loss maintenance and preservation of muscle mass.   She has agreed to :  Think about enjoyable ways to increase daily physical activity and overcoming barriers to exercise and Increase physical activity in their day and reduce sedentary time (increase NEAT).  Medical Interventions and Pharmacotherapy We will work on building a therapist, art and behavioral strategies. We will discuss the role of pharmacotherapy as an adjunct at subsequent visits.   ASSOCIATED CONDITIONS ADDRESSED TODAY LABS are ORDERED AND PT WILL RETURN TO HAVE FASTING LABS IN THE NEXT WEEK- NO LAB TODAY Other Fatigue Mckenley does feel that her weight is causing her energy to be lower than it should be. Fatigue may be related to obesity, depression or many other causes. Labs will be  ordered, and in the meanwhile, Fernande will focus on self care including making healthy food choices, increasing physical activity and focusing on stress reduction.  Shortness of Breath on exertion Crissie notes increasing shortness of breath with physical activity and seems to be worsening over time with weight gain. She notes getting out of breath sooner with activity than she used to. This has not gotten worse recently. Amily denies shortness of breath at rest or orthopnea.  Primary hypertension Continue Amlodipine  every day Start Category 2 meal plan  and DASH diet Monitor BP and if consistently >140/90 notify PCP If develops headaches, chest pain, shortness of breath or dizziness  go to ER Loss of 10-15% body weight can help improve blood pressures  Continue to follow regularly with PCP -     CBC with Differential/Platelet -     Comprehensive metabolic panel with GFR  Depression screening       Scored a zero on PHQ 9  Hyperlipidemia, unspecified hyperlipidemia type Focus on implementing category 2 meal plan, limit saturated fats Loss of 10-15% body weight can improve lipid levels Continue Atorvastatin  20 mg every day and follow regularly with PCP -     Lipid panel  Prediabetes Start Category 2  meal plan, limit simple carbohydrates Decreasing body weight by 10-15% can improve glucose levels Continue exercise with current goal of 150 minutes of moderate to high intensity exercise/week.  -     Hemoglobin A1c -     Insulin, random  Vitamin D  deficiency If Vit D level remains low will supplement with Ergocalciferol  50000 units once a week for 12 weeks and then recheck level.   -     VITAMIN D  25 Hydroxy (Vit-D Deficiency, Fractures)  Class 1 obesity with serious comorbidity and body mass index (BMI) of 33.0 to 33.9 in adult, unspecified obesity type See plan above -     CBC with Differential/Platelet -     Comprehensive metabolic panel with GFR -     Hemoglobin A1c -      Insulin, random -     Lipid panel -     Magnesium -     TSH -     Vitamin B12 -     VITAMIN D  25 Hydroxy (Vit-D Deficiency, Fractures)    Follow-up  She was informed of the importance of frequent follow-up visits to maximize her success with intensive lifestyle modifications for her multiple health conditions. She was informed we would discuss her lab results at her next visit unless there is a critical issue that needs to be addressed sooner. Ralene agreed to keep her next visit at the agreed upon time to discuss these results.  Attestation Statement  This is the patient's intake visit at [facility]. The patient's Health Questionnaire was reviewed at length. Included in the packet: current and past health history, medications, allergies, ROS, gynecologic history (women only), surgical history, family history, social history, weight history, weight loss surgery history (for those that have had weight loss surgery), nutritional evaluation, mood and food questionnaire, PHQ9, Epworth questionnaire, sleep habits questionnaire, patient life and health improvement goals questionnaire. These will all be scanned into the patient's chart under media.   During the visit, I independently reviewed the patient's EKG done at cardiology , previous labs, bioimpedance scale results, and indirect calorimetry results. I used this information to medically tailor a meal plan for the patient that will help her to lose weight and will improve her obesity-related conditions. I performed a medically necessary appropriate examination and/or evaluation. I discussed the assessment and treatment plan with the patient. The patient was provided an opportunity to ask questions and all were answered. The patient agreed with the plan and demonstrated an understanding of the instructions. Labs were ordered at this visit and will be reviewed at the next visit unless critical results need to be addressed immediately.  Clinical information was updated and documented in the EMR.   In addition, they received basic education on identification of processed foods and reduction of these, different sources of lean proteins and complex carbohydrates and how to eat balanced by incorporation of whole foods.  Reviewed by clinician on day of visit: allergies, medications, problem list, medical history, surgical history, family history, social history, and previous encounter notes.     Lonell Liverpool ANP-C "

## 2025-01-16 ENCOUNTER — Ambulatory Visit (INDEPENDENT_AMBULATORY_CARE_PROVIDER_SITE_OTHER): Payer: Self-pay | Admitting: Nurse Practitioner

## 2025-01-16 LAB — COMPREHENSIVE METABOLIC PANEL WITH GFR
ALT: 14 [IU]/L (ref 0–32)
AST: 16 [IU]/L (ref 0–40)
Albumin: 4.4 g/dL (ref 3.9–4.9)
Alkaline Phosphatase: 76 [IU]/L (ref 41–116)
BUN/Creatinine Ratio: 18 (ref 9–23)
BUN: 12 mg/dL (ref 6–24)
Bilirubin Total: 0.3 mg/dL (ref 0.0–1.2)
CO2: 23 mmol/L (ref 20–29)
Calcium: 9.6 mg/dL (ref 8.7–10.2)
Chloride: 100 mmol/L (ref 96–106)
Creatinine, Ser: 0.68 mg/dL (ref 0.57–1.00)
Globulin, Total: 2.7 g/dL (ref 1.5–4.5)
Glucose: 82 mg/dL (ref 70–99)
Potassium: 4.5 mmol/L (ref 3.5–5.2)
Sodium: 136 mmol/L (ref 134–144)
Total Protein: 7.1 g/dL (ref 6.0–8.5)
eGFR: 107 mL/min/{1.73_m2}

## 2025-01-16 LAB — CBC WITH DIFFERENTIAL/PLATELET
Basophils Absolute: 0 10*3/uL (ref 0.0–0.2)
Basos: 0 %
EOS (ABSOLUTE): 0.1 10*3/uL (ref 0.0–0.4)
Eos: 1 %
Hematocrit: 39.9 % (ref 34.0–46.6)
Hemoglobin: 12.8 g/dL (ref 11.1–15.9)
Immature Grans (Abs): 0 10*3/uL (ref 0.0–0.1)
Immature Granulocytes: 0 %
Lymphocytes Absolute: 1.4 10*3/uL (ref 0.7–3.1)
Lymphs: 27 %
MCH: 29.2 pg (ref 26.6–33.0)
MCHC: 32.1 g/dL (ref 31.5–35.7)
MCV: 91 fL (ref 79–97)
Monocytes Absolute: 0.3 10*3/uL (ref 0.1–0.9)
Monocytes: 6 %
Neutrophils Absolute: 3.5 10*3/uL (ref 1.4–7.0)
Neutrophils: 66 %
Platelets: 351 10*3/uL (ref 150–450)
RBC: 4.38 x10E6/uL (ref 3.77–5.28)
RDW: 12.8 % (ref 11.7–15.4)
WBC: 5.3 10*3/uL (ref 3.4–10.8)

## 2025-01-16 LAB — LIPID PANEL
Chol/HDL Ratio: 1.9 ratio (ref 0.0–4.4)
Cholesterol, Total: 155 mg/dL (ref 100–199)
HDL: 82 mg/dL
LDL Chol Calc (NIH): 62 mg/dL (ref 0–99)
Triglycerides: 51 mg/dL (ref 0–149)
VLDL Cholesterol Cal: 11 mg/dL (ref 5–40)

## 2025-01-16 LAB — TSH: TSH: 1.65 u[IU]/mL (ref 0.450–4.500)

## 2025-01-16 LAB — HEMOGLOBIN A1C
Est. average glucose Bld gHb Est-mCnc: 120 mg/dL
Hgb A1c MFr Bld: 5.8 % — ABNORMAL HIGH (ref 4.8–5.6)

## 2025-01-16 LAB — VITAMIN B12: Vitamin B-12: 587 pg/mL (ref 232–1245)

## 2025-01-16 LAB — MAGNESIUM: Magnesium: 1.7 mg/dL (ref 1.6–2.3)

## 2025-01-16 LAB — VITAMIN D 25 HYDROXY (VIT D DEFICIENCY, FRACTURES): Vit D, 25-Hydroxy: 27.1 ng/mL — ABNORMAL LOW (ref 30.0–100.0)

## 2025-01-16 LAB — INSULIN, RANDOM: INSULIN: 14.1 u[IU]/mL (ref 2.6–24.9)

## 2025-01-23 ENCOUNTER — Ambulatory Visit (INDEPENDENT_AMBULATORY_CARE_PROVIDER_SITE_OTHER): Admitting: Nurse Practitioner

## 2025-02-07 ENCOUNTER — Encounter (HOSPITAL_BASED_OUTPATIENT_CLINIC_OR_DEPARTMENT_OTHER): Admitting: Family
